# Patient Record
Sex: Female | Born: 1975
Health system: Southern US, Community
[De-identification: ages and names within clinical notes are randomized; demographics above are authoritative.]

## PROBLEM LIST (undated history)

## (undated) DIAGNOSIS — F3281 Premenstrual dysphoric disorder: Secondary | ICD-10-CM

## (undated) DIAGNOSIS — E669 Obesity, unspecified: Secondary | ICD-10-CM

## (undated) DIAGNOSIS — F39 Unspecified mood [affective] disorder: Secondary | ICD-10-CM

## (undated) DIAGNOSIS — E66811 Obesity, class 1: Secondary | ICD-10-CM

## (undated) DIAGNOSIS — L309 Dermatitis, unspecified: Secondary | ICD-10-CM

## (undated) HISTORY — DX: Unspecified mood (affective) disorder: F39

## (undated) HISTORY — PX: CHOLECYSTECTOMY: SHX55

## (undated) HISTORY — DX: Premenstrual dysphoric disorder: F32.81

## (undated) HISTORY — DX: Obesity, unspecified: E66.9

## (undated) HISTORY — DX: Obesity, class 1: E66.811

## (undated) HISTORY — PX: TONSILLECTOMY: SUR1361

## (undated) HISTORY — PX: WISDOM TOOTH EXTRACTION: SHX21

## (undated) HISTORY — DX: Dermatitis, unspecified: L30.9

---

## 2009-03-06 ENCOUNTER — Ambulatory Visit: Payer: Self-pay | Admitting: Family Medicine

## 2009-03-07 ENCOUNTER — Ambulatory Visit: Payer: Self-pay | Admitting: Surgery

## 2014-05-28 ENCOUNTER — Other Ambulatory Visit: Payer: Self-pay | Admitting: *Deleted

## 2017-01-07 ENCOUNTER — Telehealth: Payer: Self-pay

## 2017-01-07 NOTE — Telephone Encounter (Signed)
Pt has decided to wait and would like to try to see another provider but aware [facility]G would see her if needed.

## 2017-01-07 NOTE — Telephone Encounter (Signed)
Pt called triage line stating she has had hemorrhoids x2 weeks. She is having irritation in that area from the cream she has been using. She was wondering if [facility] could see her or does she need to go to her PCP? CB# [phone]

## 2017-01-08 ENCOUNTER — Ambulatory Visit (INDEPENDENT_AMBULATORY_CARE_PROVIDER_SITE_OTHER): Payer: 59 | Admitting: Certified Nurse Midwife

## 2017-01-08 ENCOUNTER — Encounter: Payer: Self-pay | Admitting: Certified Nurse Midwife

## 2017-01-08 VITALS — BP 110/70 | HR 71 | Ht 71.0 in | Wt 238.0 lb

## 2017-01-08 DIAGNOSIS — L249 Irritant contact dermatitis, unspecified cause: Secondary | ICD-10-CM

## 2017-01-08 DIAGNOSIS — F3281 Premenstrual dysphoric disorder: Secondary | ICD-10-CM | POA: Diagnosis not present

## 2017-01-08 DIAGNOSIS — L292 Pruritus vulvae: Secondary | ICD-10-CM | POA: Diagnosis not present

## 2017-01-08 DIAGNOSIS — L309 Dermatitis, unspecified: Secondary | ICD-10-CM | POA: Diagnosis not present

## 2017-01-08 DIAGNOSIS — F39 Unspecified mood [affective] disorder: Secondary | ICD-10-CM

## 2017-01-08 MED ORDER — TRIAMCINOLONE ACETONIDE 0.1 % EX OINT: 1.0000 "application " | TOPICAL_OINTMENT | Freq: Two times a day (BID) | CUTANEOUS | 0 refills | Status: DC

## 2017-01-11 ENCOUNTER — Encounter: Payer: Self-pay | Admitting: Certified Nurse Midwife

## 2017-01-11 DIAGNOSIS — F39 Unspecified mood [affective] disorder: Secondary | ICD-10-CM | POA: Insufficient documentation

## 2017-01-11 DIAGNOSIS — F3281 Premenstrual dysphoric disorder: Secondary | ICD-10-CM | POA: Insufficient documentation

## 2017-01-11 DIAGNOSIS — L309 Dermatitis, unspecified: Secondary | ICD-10-CM | POA: Insufficient documentation

## 2017-01-11 LAB — POCT WET PREP (WET MOUNT): TRICHOMONAS WET PREP HPF POC: ABSENT

## 2017-01-11 NOTE — Progress Notes (Signed)
Obstetrics & Gynecology Office Visit   Chief Complaint:  Chief Complaint  Patient presents with  . Pruritis    and irritation of vulva    History of Present Illness: 41 year old G3 P2013 who presents with complaints of vulvar itching and irritation after a recent lake trip. Prior to her lake trip she was using Preparation H for a hemorrhoid flare. While at the [place] she was swimming in the lake and last week had her menses. After the menses she began feeling irritated and itchy from the clitoral area to the perineal area. She used vagasil without relief. Has been taking antihistamines which has helped the itching. No increased vaginal discharge. Past medical history remarkable for eczema.  She also reports increased irritability premenstrually despite taking 100 mgm of Zoloft daily. She has been increasing her Zoloft to 150 mgm daily. Wants to know if she can stay on the dose. Has been on the Zoloft for many years for PMDD and a mood disorder   Review of Systems:  Review of Systems  Constitutional: Negative for chills and fever.  Genitourinary: Negative for dysuria.       See HPI  Skin: Positive for itching and rash.  Psychiatric/Behavioral:        Positive for irritability     Past Medical History:  Past Medical History:  Diagnosis Date  . Eczema   . Mood disorder ([place])   . PMDD (premenstrual dysphoric disorder)     Past Surgical History:  Past Surgical History:  Procedure Laterality Date  . CESAREAN SECTION  2006   Twins  . CHOLECYSTECTOMY    . TONSILLECTOMY    . WISDOM TOOTH EXTRACTION      Gynecologic History: Patient's last menstrual period was 01/01/2017 (exact date).  Obstetric History: [id number]  Family History:  Family History  Problem Relation Age of Onset  . Atrial fibrillation Mother   . Hypertension Mother   . Hyperlipidemia Mother   . Parkinson's disease Mother   . Prostate cancer Maternal Uncle 72  . Prostate cancer Father 53  . Prostate cancer  Paternal Grandfather     Social History:  Social History   Social History  . Marital status: Married    Spouse name: N/A  . Number of children: 3  . Years of education: N/A   Occupational History  . Not on file.   Social History Main Topics  . Smoking status: Never Smoker  . Smokeless tobacco: Never Used  . Alcohol use Yes  . Drug use: No  . Sexual activity: Yes    Birth control/ protection: None, Other-see comments     Comment: vasectomy   Other Topics Concern  . Not on file   Social History Narrative  . No narrative on file    Allergies:  Allergies  Allergen Reactions  . Hydrocodone Itching and Rash    Medications: Prior to Admission medications   Medication Sig Start Date End Date Taking? Authorizing Provider  sertraline (ZOLOFT) 100 MG tablet  01/07/17   [provider]  triamcinolone ointment (KENALOG) 0.1 % Apply 1 application topically 2 (two) times daily. 01/08/17   Dalia Heading, CNM    Physical Exam Vitals: BP 110/70   Pulse 71   Ht 5' 11"  (1.803 m)   Wt 238 lb (108 kg)   LMP 01/01/2017 (Exact Date)   BMI 33.19 kg/m   Physical Exam  Constitutional: She is oriented to person, place, and time. She appears well-developed and well-nourished. No  distress.  Genitourinary: Rectal exam shows external hemorrhoid (not thrombosed).  Genitourinary Comments: Vulva: no inflammation. Few red maculopapular lesions on labia Vagina: brown blood tinged discharge Cervix: no lesions   Neurological: She is alert and oriented to person, place, and time. She has normal reflexes.  Psychiatric: She has a normal mood and affect. Her behavior is normal.   Results for orders placed or performed in visit on 01/08/17 (from the past 24 hour(s))  POCT Wet Prep [facility])     Status: Normal   Collection Time: 01/11/17 10:07 PM  Result Value Ref Range   Source Wet Prep POC vagina    WBC, Wet Prep HPF POC     Bacteria Wet Prep HPF POC  Few   BACTERIA WET PREP  MORPHOLOGY POC     Clue Cells Wet Prep HPF POC None None   Clue Cells Wet Prep Whiff POC     Yeast Wet Prep HPF POC None    KOH Wet Prep POC     Trichomonas Wet Prep HPF POC Absent Absent    Assessment: 41 y.o. [id number] possible contact dermatitis No evidence of vaginitis Hemorrhoids Mood disorder/ PMDD-worsening symptoms  Plan: Kenalog 0.1% ointment-AAA 2-3 x/day prn itching Can increase Zoloft to 150 mgm daily. Recommend seeing a mental health provider if increasing Zoloft not effective-may need to be on a different medication Follow up for annual and prn persistent itching  Dalia Heading, CNM

## 2017-01-22 ENCOUNTER — Ambulatory Visit: Payer: Self-pay | Admitting: Certified Nurse Midwife

## 2017-07-30 DIAGNOSIS — M7541 Impingement syndrome of right shoulder: Secondary | ICD-10-CM | POA: Diagnosis not present

## 2017-11-04 ENCOUNTER — Ambulatory Visit: Payer: 59 | Admitting: Certified Nurse Midwife

## 2017-12-03 ENCOUNTER — Ambulatory Visit: Payer: Self-pay | Admitting: Certified Nurse Midwife

## 2017-12-22 ENCOUNTER — Ambulatory Visit (INDEPENDENT_AMBULATORY_CARE_PROVIDER_SITE_OTHER): Payer: BLUE CROSS/BLUE SHIELD | Admitting: Certified Nurse Midwife

## 2017-12-22 ENCOUNTER — Encounter: Payer: Self-pay | Admitting: Certified Nurse Midwife

## 2017-12-22 VITALS — BP 110/66 | HR 73 | Ht 71.0 in | Wt 240.0 lb

## 2017-12-22 DIAGNOSIS — Z1231 Encounter for screening mammogram for malignant neoplasm of breast: Secondary | ICD-10-CM | POA: Diagnosis not present

## 2017-12-22 DIAGNOSIS — Z124 Encounter for screening for malignant neoplasm of cervix: Secondary | ICD-10-CM | POA: Diagnosis not present

## 2017-12-22 DIAGNOSIS — Z01419 Encounter for gynecological examination (general) (routine) without abnormal findings: Secondary | ICD-10-CM

## 2017-12-22 DIAGNOSIS — Z0000 Encounter for general adult medical examination without abnormal findings: Secondary | ICD-10-CM | POA: Diagnosis not present

## 2017-12-22 DIAGNOSIS — Z1239 Encounter for other screening for malignant neoplasm of breast: Secondary | ICD-10-CM

## 2017-12-22 DIAGNOSIS — Z23 Encounter for immunization: Secondary | ICD-10-CM

## 2017-12-22 MED ORDER — SERTRALINE HCL 100 MG PO TABS
100.0000 mg | ORAL_TABLET | Freq: Every day | ORAL | 3 refills | Status: DC
Start: 2017-12-22 — End: 2019-03-03

## 2017-12-22 NOTE — Progress Notes (Signed)
Gynecology Annual Exam  PCP: Patient, No Pcp Per  Chief Complaint:  Chief Complaint  Patient presents with  . Gynecologic Exam    History of Present Illness:Kristine Ochoa is a 42 year old Caucasian/White female , G 3 P 2 1 1 3  , who presents for her annual exam . She is having problems with heavy menses.  Her menses are regular and her LMP was 11/27/2017 . They occur every 1 month , they last 5-7 days, with two heavier days requiring tampon change every 30-60 min and are accompanied by clots. Her bleeding does decrease with naprasyn, when she remembers to take the medication.  She has had no spotting.  She denies dysmenorrhea.  The patient's past medical history is remarkable for obesity (BMI=33.5 kg/m2) and a mood disorder for which she takes Zoloft. . She was having more problems with irritability premenstrually last July and she was given an RX for 150 mgm Zoloft daily, but she never had that filled. She remains on 100 mgm daily. Since her last annual GYN exam dated 07/10/2016, she reports no significant changes in her health She is sexually active. She is currently using a vasectomy for contraception.  Her most recent pap smear was obtained 07/10/2016 and was negative.  Her most recent mammogram obtained on 05/24/2015 was normal.  There is no family history of breast cancer.  There is no family history of ovarian cancer.  The patient does do occasional self breast exams.  The patient does not smoke.  The patient does drink socially. (0-6 glasses wine on the weekend)  The patient does not use illegal drugs.  The patient exercises regularly 4-5x/week.  The patient does get adequate calcium in her diet.  She had a recent cholesterol screen in 2016 that was normal.    .  Review of Systems: Review of Systems  Constitutional: Negative for chills, fever and weight loss.  HENT: Negative for congestion, sinus pain and sore throat.   Eyes: Negative for blurred vision and pain.    Respiratory: Negative for hemoptysis, shortness of breath and wheezing.   Cardiovascular: Negative for chest pain, palpitations and leg swelling.  Gastrointestinal: Negative for abdominal pain, blood in stool, diarrhea, heartburn, nausea and vomiting.  Genitourinary: Negative for dysuria, frequency, hematuria and urgency.  Musculoskeletal: Negative for back pain, joint pain and myalgias.  Skin: Negative for itching and rash.  Neurological: Negative for dizziness, tingling and headaches.  Endo/Heme/Allergies: Negative for environmental allergies and polydipsia. Does not bruise/bleed easily.       Negative for hirsutism   Psychiatric/Behavioral: Negative for depression. The patient is not nervous/anxious and does not have insomnia.     Past Medical History:  Past Medical History:  Diagnosis Date  . Eczema   . Mood disorder ([place])   . Obesity (BMI 30.0-34.9)   . PMDD (premenstrual dysphoric disorder)     Past Surgical History:  Past Surgical History:  Procedure Laterality Date  . CESAREAN SECTION  2006   Twins  . CHOLECYSTECTOMY    . TONSILLECTOMY    . WISDOM TOOTH EXTRACTION      Family History:  Family History  Problem Relation Age of Onset  . Atrial fibrillation Mother   . Hypertension Mother   . Hyperlipidemia Mother   . Parkinson's disease Mother   . Prostate cancer Father 65  . Atrial fibrillation Father   . Prostate cancer Paternal Grandfather   . Prostate cancer Paternal Uncle  Social History:  Social History   Socioeconomic History  . Marital status: Married    Spouse name: Not on file  . Number of children: 3  . Years of education: Not on file  . Highest education level: Not on file  Occupational History  . Not on file  Social Needs  . Financial resource strain: Not on file  . Food insecurity:    Worry: Not on file    Inability: Not on file  . Transportation needs:    Medical: Not on file    Non-medical: Not on file  Tobacco Use  . Smoking  status: Never Smoker  . Smokeless tobacco: Never Used  Substance and Sexual Activity  . Alcohol use: Yes  . Drug use: No  . Sexual activity: Yes    Birth control/protection: Other-see comments    Comment: vasectomy  Lifestyle  . Physical activity:    Days per week: 5 days    Minutes per session: 40 min  . Stress: Not at all  Relationships  . Social connections:    Talks on phone: Not on file    Gets together: Not on file    Attends religious service: Not on file    Active member of club or organization: Not on file    Attends meetings of clubs or organizations: Not on file    Relationship status: Not on file  . Intimate partner violence:    Fear of current or ex partner: Not on file    Emotionally abused: Not on file    Physically abused: Not on file    Forced sexual activity: Not on file  Other Topics Concern  . Not on file  Social History Narrative  . Not on file    Allergies:  Allergies  Allergen Reactions  . Hydrocodone Itching, Rash and Hives    Medications: Zoloft 100 mgm daily  Physical Exam Vitals: There were no vitals taken for this visit.  General: NAD HEENT: normocephalic, anicteric Neck: no thyroid enlargement, no palpable nodules, no cervical lymphadenopathy  Pulmonary: No increased work of breathing, CTAB Cardiovascular: RRR, with a Grade II/VI systolic murmur at the LSB  Breast: Breast symmetrical, no tenderness, no palpable nodules or masses, no skin or nipple retraction present, no nipple discharge.  No axillary, infraclavicular or supraclavicular lymphadenopathy. Abdomen: Soft, non-tender, non-distended.  Umbilicus without lesions.  No hepatomegaly or masses palpable. No evidence of hernia. Genitourinary:  External: Normal external female genitalia.  Normal urethral meatus, normal Bartholin's and Skene's glands.    Vagina: Normal vaginal mucosa, no evidence of prolapse.    Cervix: Grossly normal in appearance, no bleeding, non-tender,  posterior  Uterus: Anteverted, normal size, shape, and consistency, mobile, and non-tender  Adnexa: No adnexal masses, non-tender  Rectal: deferred  Lymphatic: no evidence of inguinal lymphadenopathy Extremities: no edema, erythema, or tenderness Neurologic: Grossly intact Psychiatric: mood appropriate, affect full     Assessment: 42 y.o. [id number] annual gyn exam Mood disorder controlled on Zoloft 100 mgm daily  Plan:   1) Breast cancer screening - recommend monthly self breast exam and annual mammograms. Mammogram was ordered today.  2) Zoloft 100 mgm refilled #90/ RF x 3.  3) Cervical cancer screening - Pap was done.  4) Routine healthcare maintenance including cholesterol and diabetes screening UTD  Immunizations: Last TD>10 years ago. TDAP today  ) RTO 1 year and prn  Dalia Heading, CNM

## 2017-12-27 LAB — IGP, APTIMA HPV
HPV APTIMA: NEGATIVE
PAP SMEAR COMMENT: 0

## 2018-06-13 ENCOUNTER — Encounter: Payer: Self-pay | Admitting: Obstetrics and Gynecology

## 2018-06-13 ENCOUNTER — Ambulatory Visit (INDEPENDENT_AMBULATORY_CARE_PROVIDER_SITE_OTHER): Payer: BLUE CROSS/BLUE SHIELD | Admitting: Obstetrics and Gynecology

## 2018-06-13 VITALS — BP 110/70 | HR 74 | Ht 71.0 in | Wt 243.0 lb

## 2018-06-13 DIAGNOSIS — K644 Residual hemorrhoidal skin tags: Secondary | ICD-10-CM | POA: Insufficient documentation

## 2018-06-13 MED ORDER — HYDROCORTISONE ACE-PRAMOXINE 2.5-1 % RE CREA: 1.0000 "application " | TOPICAL_CREAM | Freq: Two times a day (BID) | RECTAL | 0 refills | Status: AC

## 2018-06-13 NOTE — Progress Notes (Signed)
Patient, No Pcp Per   Chief Complaint  Patient presents with  . Hemorrhoids    kind of like a rash, x a week or so     HPI:      Ms. JON KASPAREK is a 42 y.o. [phone] who LMP was Patient's last menstrual period was 06/06/2018 (approximate)., presents today for hemorrhoid flare for the past wk. Sx occur due to constipation with menses. Has hx of them in past. Causes severe itching, burning, pain that radiates to vagina. No vaginal d/c, odor. No rectal bleeding. Has tried ice this time, and prep H wipes in past. Pt is very uncomfortable due to sx.    Past Medical History:  Diagnosis Date  . Eczema   . Mood disorder ([place])   . Obesity (BMI 30.0-34.9)   . PMDD (premenstrual dysphoric disorder)     Past Surgical History:  Procedure Laterality Date  . CESAREAN SECTION  2006   Twins  . CHOLECYSTECTOMY    . TONSILLECTOMY    . WISDOM TOOTH EXTRACTION      Family History  Problem Relation Age of Onset  . Atrial fibrillation Mother   . Hypertension Mother   . Hyperlipidemia Mother   . Parkinson's disease Mother 63  . Prostate cancer Father 39  . Atrial fibrillation Father   . Prostate cancer Paternal Grandfather   . Prostate cancer Paternal Uncle     Social History   Socioeconomic History  . Marital status: Married    Spouse name: Not on file  . Number of children: 3  . Years of education: Not on file  . Highest education level: Not on file  Occupational History  . Not on file  Social Needs  . Financial resource strain: Not on file  . Food insecurity:    Worry: Not on file    Inability: Not on file  . Transportation needs:    Medical: Not on file    Non-medical: Not on file  Tobacco Use  . Smoking status: Never Smoker  . Smokeless tobacco: Never Used  Substance and Sexual Activity  . Alcohol use: Yes  . Drug use: No  . Sexual activity: Yes    Birth control/protection: Other-see comments    Comment: vasectomy  Lifestyle  . Physical activity:   Days per week: 5 days    Minutes per session: 40 min  . Stress: Not at all  Relationships  . Social connections:    Talks on phone: Not on file    Gets together: Not on file    Attends religious service: Not on file    Active member of club or organization: Not on file    Attends meetings of clubs or organizations: Not on file    Relationship status: Not on file  . Intimate partner violence:    Fear of current or ex partner: Not on file    Emotionally abused: Not on file    Physically abused: Not on file    Forced sexual activity: Not on file  Other Topics Concern  . Not on file  Social History Narrative  . Not on file    Outpatient Medications Prior to Visit  Medication Sig Dispense Refill  . sertraline (ZOLOFT) 100 MG tablet Take 1 tablet (100 mg total) by mouth daily. 90 tablet 3   No facility-administered medications prior to visit.     ROS:  Review of Systems  Constitutional: Negative for fever.  Gastrointestinal: Negative for blood in stool,  constipation, diarrhea, nausea and vomiting.  Genitourinary: Positive for vaginal pain. Negative for dyspareunia, dysuria, flank pain, frequency, hematuria, urgency, vaginal bleeding and vaginal discharge.  Musculoskeletal: Negative for back pain.  Skin: Negative for rash.    OBJECTIVE:   Vitals:  BP 110/70   Pulse 74   Ht 5' 11"  (1.803 m)   Wt 243 lb (110.2 kg)   LMP 06/06/2018 (Approximate)   BMI 33.89 kg/m   Physical Exam  Constitutional: She is oriented to person, place, and time. She appears well-developed.  Neck: Normal range of motion.  Pulmonary/Chest: Effort normal.  Genitourinary:    There is no rash, tenderness or lesion on the right labia. There is no rash, tenderness or lesion on the left labia.  Genitourinary Comments: TOO TENDER FOR RECTAL EXAM  Musculoskeletal: Normal range of motion.  Neurological: She is alert and oriented to person, place, and time. No cranial nerve deficit.  Psychiatric: She  has a normal mood and affect. Her behavior is normal. Judgment and thought content normal.  Vitals reviewed.   Assessment/Plan: External hemorrhoids - Sitz baths/Rx anusol cream/cold compresses. Offered GI ref for future tx options. Pt to consider and f/u if desires.  - Plan: hydrocortisone-pramoxine (ANALPRAM-HC) 2.5-1 % rectal cream    Meds ordered this encounter  Medications  . hydrocortisone-pramoxine (ANALPRAM-HC) 2.5-1 % rectal cream    Sig: Place 1 application rectally 2 (two) times daily for 14 days.    Dispense:  30 g    Refill:  0    Order Specific Question:   Supervising Provider    Answer:   Gae Dry [[id number]]      Return if symptoms worsen or fail to improve.  Nissa Stannard B. Caylie Sandquist, PA-C 06/13/2018 1:58 PM

## 2018-06-13 NOTE — Patient Instructions (Signed)
I value your feedback and entrusting us with your care. If you get a Kristine Ochoa patient survey, I would appreciate you taking the time to let us know about your experience today. Thank you! 

## 2018-06-14 ENCOUNTER — Telehealth: Payer: Self-pay

## 2018-06-14 NOTE — Telephone Encounter (Signed)
Please advise 

## 2018-06-14 NOTE — Telephone Encounter (Addendum)
Pt is aware and doesn't think its a reaction to the hemorrhoid med.Marland Kitchen

## 2018-06-14 NOTE — Telephone Encounter (Signed)
Pt was seen yesterday and given medication for hemorrhoids.  States it has seemed to work.  Her hemorrhoids have decreased in size.  She does have a red rash from itching all around outside of vagina, around leg a back.  Is asking for a steroid cream to use on skin and outside of vagina.  [phone]

## 2018-06-14 NOTE — Telephone Encounter (Signed)
She didn't have this rash yesterday on exam. Is she having reaction to analpram hemorrhoid med? Given the location, best to use mild steroid, like over-the-counter 1% hydrocortisone crm QD to BID.

## 2018-06-16 DIAGNOSIS — N762 Acute vulvitis: Secondary | ICD-10-CM | POA: Diagnosis not present

## 2018-07-25 DIAGNOSIS — K648 Other hemorrhoids: Secondary | ICD-10-CM | POA: Diagnosis not present

## 2019-02-06 DIAGNOSIS — R300 Dysuria: Secondary | ICD-10-CM | POA: Diagnosis not present

## 2019-03-03 ENCOUNTER — Other Ambulatory Visit: Payer: Self-pay | Admitting: Certified Nurse Midwife

## 2019-05-29 ENCOUNTER — Ambulatory Visit: Payer: BLUE CROSS/BLUE SHIELD | Admitting: Certified Nurse Midwife

## 2019-06-12 ENCOUNTER — Other Ambulatory Visit: Payer: Self-pay

## 2019-06-12 DIAGNOSIS — Z20822 Contact with and (suspected) exposure to covid-19: Secondary | ICD-10-CM

## 2019-06-13 LAB — NOVEL CORONAVIRUS, NAA: SARS-CoV-2, NAA: NOT DETECTED

## 2019-06-23 ENCOUNTER — Other Ambulatory Visit: Payer: Self-pay | Admitting: Certified Nurse Midwife

## 2019-06-26 ENCOUNTER — Telehealth: Payer: Self-pay | Admitting: Certified Nurse Midwife

## 2019-06-26 NOTE — Telephone Encounter (Signed)
-----   Message from Dalia Heading, [place] sent at 06/23/2019  4:58 PM EST ----- Regarding: schedule annual appointment Please call patient to schedule her annual which is way overdue. Refilled Zoloft for 2 more months. No further refills until she has annual. Thank you, Jaclyn Shaggy

## 2019-06-26 NOTE — Telephone Encounter (Signed)
Patient is schedule for 07/28/19 with CLG

## 2019-07-28 ENCOUNTER — Ambulatory Visit: Payer: Self-pay | Admitting: Certified Nurse Midwife

## 2019-08-02 DIAGNOSIS — Z20828 Contact with and (suspected) exposure to other viral communicable diseases: Secondary | ICD-10-CM | POA: Diagnosis not present

## 2019-08-07 ENCOUNTER — Ambulatory Visit: Payer: Self-pay | Admitting: Certified Nurse Midwife

## 2019-08-08 DIAGNOSIS — Z20828 Contact with and (suspected) exposure to other viral communicable diseases: Secondary | ICD-10-CM | POA: Diagnosis not present

## 2019-08-25 ENCOUNTER — Other Ambulatory Visit: Payer: Self-pay | Admitting: Certified Nurse Midwife

## 2019-09-07 DIAGNOSIS — Z20828 Contact with and (suspected) exposure to other viral communicable diseases: Secondary | ICD-10-CM | POA: Diagnosis not present

## 2019-09-28 DIAGNOSIS — M7712 Lateral epicondylitis, left elbow: Secondary | ICD-10-CM | POA: Diagnosis not present

## 2019-10-03 NOTE — Progress Notes (Signed)
Gynecology Annual Exam  PCP: Patient, No Pcp Per  Chief Complaint:  Chief Complaint  Patient presents with  . Gynecologic Exam    whatever needs to be done    History of Present Illness:Kristine Ochoa is a 44 year old Caucasian/White female , G 3 P 2 1 1 3  , who presents for her annual exam . She is having no significant gyn problems.  Her menses are regular and her LMP was 09/12/2019 . They occur every 1 month , they last 4-6 days, usually with two heavier days requiring tampon change every 30-60 min and are accompanied by clots. She reports that over the last 6-7 months her flow has decreased over all. She has had no spotting.  She denies dysmenorrhea.  The patient's past medical history is remarkable for obesity (current BMI=32.52 kg/m2) and a mood disorder for which she takes Zoloft 100 mgm daily. Since her last annual GYN exam dated 12/22/2017 , she reports no significant changes in her health. She has lost 10# since her last annual with portion control, decreased junk food and walking.  She is sexually active. She is currently using a vasectomy for contraception.  Her most recent pap smear was obtained 12/22/2017 and was NIL/negative HRHPV.  Her most recent mammogram obtained on 05/24/2015 was normal.  There is no family history of breast cancer.  There is no family history of ovarian cancer.  The patient does do  self breast exams.  The patient does not smoke.  The patient does drink socially. (0-7 glasses wine/ week)  The patient does not use illegal drugs.  The patient exercises regularly 2x/week and walks most days of the week  The patient does get adequate calcium in her diet.  She had a  cholesterol screen in 2016 that was normal.    .  Review of Systems: Review of Systems  Constitutional: Negative for chills, fever and weight loss.  HENT: Negative for congestion, sinus pain and sore throat.   Eyes: Negative for blurred vision and pain.  Respiratory: Negative  for hemoptysis, shortness of breath and wheezing.   Cardiovascular: Negative for chest pain, palpitations and leg swelling.  Gastrointestinal: Negative for abdominal pain, blood in stool, diarrhea, heartburn, nausea and vomiting.  Genitourinary: Negative for dysuria, frequency, hematuria and urgency.  Musculoskeletal: Negative for back pain, joint pain and myalgias.  Skin: Negative for itching and rash.  Neurological: Negative for dizziness, tingling and headaches.  Endo/Heme/Allergies: Negative for environmental allergies and polydipsia. Does not bruise/bleed easily.       Negative for hirsutism   Psychiatric/Behavioral: Negative for depression. The patient is not nervous/anxious and does not have insomnia.     Past Medical History:  Past Medical History:  Diagnosis Date  . Eczema   . Mood disorder ([place])   . Obesity (BMI 30.0-34.9)   . PMDD (premenstrual dysphoric disorder)     Past Surgical History:  Past Surgical History:  Procedure Laterality Date  . CESAREAN SECTION  2006   Twins  . CHOLECYSTECTOMY    . TONSILLECTOMY    . WISDOM TOOTH EXTRACTION      Family History:  Family History  Problem Relation Age of Onset  . Atrial fibrillation Mother   . Hypertension Mother   . Hyperlipidemia Mother   . Parkinson's disease Mother 2  . Prostate cancer Father 13  . Atrial fibrillation Father   . Prostate cancer Paternal Grandfather   . Prostate cancer Paternal Uncle  Social History:  Social History   Socioeconomic History  . Marital status: Married    Spouse name: Not on file  . Number of children: 3  . Years of education: Not on file  . Highest education level: Not on file  Occupational History  . Not on file  Tobacco Use  . Smoking status: Never Smoker  . Smokeless tobacco: Never Used  Substance and Sexual Activity  . Alcohol use: Yes  . Drug use: No  . Sexual activity: Yes    Birth control/protection: Other-see comments    Comment: vasectomy  Other  Topics Concern  . Not on file  Social History Narrative  . Not on file   Social Determinants of Health   Financial Resource Strain:   . Difficulty of Paying Living Expenses:   Food Insecurity:   . Worried About Charity fundraiser in the Last Year:   . Arboriculturist in the Last Year:   Transportation Needs:   . Film/video editor (Medical):   Marland Kitchen Lack of Transportation (Non-Medical):   Physical Activity:   . Days of Exercise per Week:   . Minutes of Exercise per Session:   Stress:   . Feeling of Stress :   Social Connections:   . Frequency of Communication with Friends and Family:   . Frequency of Social Gatherings with Friends and Family:   . Attends Religious Services:   . Active Member of Clubs or Organizations:   . Attends Archivist Meetings:   Marland Kitchen Marital Status:   Intimate Partner Violence:   . Fear of Current or Ex-Partner:   . Emotionally Abused:   Marland Kitchen Physically Abused:   . Sexually Abused:     Allergies:  Allergies  Allergen Reactions  . Hydrocodone Itching, Rash and Hives    Medications:  Current Outpatient Medications:  .  sertraline (ZOLOFT) 100 MG tablet, Take 1 tablet (100 mg total) by mouth daily., Disp: 90 tablet, Rfl: 3 Vitamin D3 1000 IU and multivitamin daily x 1-2 months Physical Exam Vitals: BP 110/64   Pulse 66   Ht 5' 10.5" (1.791 m)   Wt 230 lb (104.3 kg)   LMP 09/12/2019 (Approximate)   BMI 32.54 kg/m   General: WF in NAD HEENT: normocephalic, anicteric Neck: no thyroid enlargement, no palpable nodules, no cervical lymphadenopathy  Pulmonary: No increased work of breathing, CTAB Cardiovascular: RRR, without murmur Breast: Breast symmetrical, no tenderness, no palpable nodules or masses, no skin or nipple retraction present, no nipple discharge.  No axillary, infraclavicular or supraclavicular lymphadenopathy. Abdomen: Soft, non-tender, non-distended.  Umbilicus without lesions.  No hepatomegaly or masses palpable. No  evidence of hernia. Genitourinary:  External: Normal external female genitalia.  Normal urethral meatus, normal Bartholin's and Skene's glands.    Vagina: Normal vaginal mucosa, no evidence of prolapse.    Cervix: Grossly normal in appearance, no bleeding, non-tender, posterior  Uterus: Anteverted, normal size, shape, and consistency, mobile, and non-tender  Adnexa: No adnexal masses, non-tender  Rectal: deferred  Lymphatic: no evidence of inguinal lymphadenopathy Extremities: no edema, erythema, or tenderness Neurologic: Grossly intact Psychiatric: mood appropriate, affect full     Assessment: 44 y.o. [id number] annual gyn exam Mood disorder controlled on Zoloft 100 mgm daily  Plan:   1) Breast cancer screening - recommend monthly self breast exam and annual mammograms. Mammogram was ordered today.  2) Zoloft 100 mgm refilled #90/ RF x 3.  3) Cervical cancer screening - Pap was not done.  Desires Pap smears every 3 years. Next due in 2022  4) Routine healthcare maintenance including cholesterol and diabetes screening ordered today. Discussed options for colon cancer screening: FIT testing, Cologuard, colonoscopy. To consider options and let me know if she desires to start this year.  5)  RTO 1 year and prn  Dalia Heading, CNM

## 2019-10-04 ENCOUNTER — Other Ambulatory Visit: Payer: Self-pay

## 2019-10-04 ENCOUNTER — Encounter: Payer: Self-pay | Admitting: Certified Nurse Midwife

## 2019-10-04 ENCOUNTER — Ambulatory Visit (INDEPENDENT_AMBULATORY_CARE_PROVIDER_SITE_OTHER): Payer: BLUE CROSS/BLUE SHIELD | Admitting: Certified Nurse Midwife

## 2019-10-04 VITALS — BP 110/64 | HR 66 | Ht 70.5 in | Wt 230.0 lb

## 2019-10-04 DIAGNOSIS — Z131 Encounter for screening for diabetes mellitus: Secondary | ICD-10-CM | POA: Diagnosis not present

## 2019-10-04 DIAGNOSIS — Z01419 Encounter for gynecological examination (general) (routine) without abnormal findings: Secondary | ICD-10-CM

## 2019-10-04 DIAGNOSIS — Z13220 Encounter for screening for lipoid disorders: Secondary | ICD-10-CM | POA: Diagnosis not present

## 2019-10-04 DIAGNOSIS — Z1239 Encounter for other screening for malignant neoplasm of breast: Secondary | ICD-10-CM

## 2019-10-04 DIAGNOSIS — F39 Unspecified mood [affective] disorder: Secondary | ICD-10-CM

## 2019-10-04 MED ORDER — SERTRALINE HCL 100 MG PO TABS: 100.0000 mg | ORAL_TABLET | Freq: Every day | ORAL | 3 refills | Status: DC

## 2019-10-05 LAB — LIPID PANEL WITH LDL/HDL RATIO
Cholesterol, Total: 200 mg/dL — ABNORMAL HIGH (ref 100–199)
HDL: 59 mg/dL (ref >39)
LDL Chol Calc (NIH): 114 mg/dL — ABNORMAL HIGH (ref 0–99)
LDL/HDL Ratio: 1.9 ratio (ref 0.0–3.2)
Triglycerides: 154 mg/dL — ABNORMAL HIGH (ref 0–149)
VLDL Cholesterol Cal: 27 mg/dL (ref 5–40)

## 2019-10-05 LAB — HEMOGLOBIN A1C
Est. average glucose Bld gHb Est-mCnc: 108 mg/dL
Hgb A1c MFr Bld: 5.4 % (ref 4.8–5.6)

## 2019-10-08 ENCOUNTER — Encounter: Payer: Self-pay | Admitting: Certified Nurse Midwife

## 2019-11-08 DIAGNOSIS — Z0000 Encounter for general adult medical examination without abnormal findings: Secondary | ICD-10-CM | POA: Diagnosis not present

## 2019-11-08 DIAGNOSIS — E6609 Other obesity due to excess calories: Secondary | ICD-10-CM | POA: Diagnosis not present

## 2019-11-08 DIAGNOSIS — Z6832 Body mass index (BMI) 32.0-32.9, adult: Secondary | ICD-10-CM | POA: Diagnosis not present

## 2019-11-08 DIAGNOSIS — F39 Unspecified mood [affective] disorder: Secondary | ICD-10-CM | POA: Diagnosis not present

## 2020-01-04 DIAGNOSIS — Z20828 Contact with and (suspected) exposure to other viral communicable diseases: Secondary | ICD-10-CM | POA: Diagnosis not present

## 2020-01-04 DIAGNOSIS — U071 COVID-19: Secondary | ICD-10-CM | POA: Diagnosis not present

## 2020-01-04 DIAGNOSIS — Z03818 Encounter for observation for suspected exposure to other biological agents ruled out: Secondary | ICD-10-CM | POA: Diagnosis not present

## 2020-01-29 DIAGNOSIS — J0100 Acute maxillary sinusitis, unspecified: Secondary | ICD-10-CM | POA: Diagnosis not present

## 2020-01-29 DIAGNOSIS — R062 Wheezing: Secondary | ICD-10-CM | POA: Diagnosis not present

## 2020-01-29 DIAGNOSIS — Z8616 Personal history of COVID-19: Secondary | ICD-10-CM | POA: Diagnosis not present

## 2020-01-29 DIAGNOSIS — R05 Cough: Secondary | ICD-10-CM | POA: Diagnosis not present

## 2020-12-10 ENCOUNTER — Other Ambulatory Visit: Payer: Self-pay

## 2020-12-10 ENCOUNTER — Encounter: Payer: Self-pay | Admitting: Family Medicine

## 2020-12-10 ENCOUNTER — Ambulatory Visit: Payer: BLUE CROSS/BLUE SHIELD | Admitting: Family Medicine

## 2020-12-10 ENCOUNTER — Ambulatory Visit: Payer: Self-pay

## 2020-12-10 ENCOUNTER — Ambulatory Visit (INDEPENDENT_AMBULATORY_CARE_PROVIDER_SITE_OTHER): Payer: BLUE CROSS/BLUE SHIELD

## 2020-12-10 VITALS — BP 110/64 | HR 80 | Ht 70.5 in | Wt 243.2 lb

## 2020-12-10 DIAGNOSIS — M1812 Unilateral primary osteoarthritis of first carpometacarpal joint, left hand: Secondary | ICD-10-CM | POA: Diagnosis not present

## 2020-12-10 DIAGNOSIS — M25532 Pain in left wrist: Secondary | ICD-10-CM

## 2020-12-10 NOTE — Progress Notes (Signed)
   I, Kristine Ochoa, LAT, ATC, am serving as scribe for Dr. Lynne Ochoa.  Subjective:    CC: Left hand pain  HPI: Pt is a 45 y/o RHD female c/o L radial-sided hand and wrist pain x 6 months .  Pt was previously seen by [facility] on 09/28/19 for L lateral epicondylitis and was treated w/ an injection and a counterforce strap. Pt locates pain to her L radial wrist and thumb.  Neck pain: No UE numbness/tingling: yes at her L radial wrist and thumb UE weakness: No Aggravates: gripping; opening things Treatments tried: IBU; brace  Pertinent review of Systems: No fevers or chills  Relevant historical information: Hemorrhoids.  PMDD   Objective:    Vitals:   12/10/20 1534  BP: 110/64  Pulse: 80  SpO2: 95%   General: Well Developed, well nourished, and in no acute distress.   MSK: Left wrist normal. Left thumb slight bossing first CMC. Normal wrist and thumb motion. Normal strength. Nontender radial styloid.  Nontender snuffbox.  Nontender into the thumb or wrist. Negative Finkelstein's test. Pulses cap refill and sensation are intact.    Lab and Radiology Results  Diagnostic Limited MSK Ultrasound of: Left wrist and thumb Normal-appearing first dorsal wrist compartment.  No tenosynovitis apparent. Normal bony structures. First CMC degenerative with effusion Impression: First CMC DJD with effusion  X-ray images left hand obtained today personally and independently interpreted [facility] DJD mild to moderate.  No acute fractures. Await formal radiology review    Impression and Recommendations:    Assessment and Plan: 45 y.o. female with left wrist/base of thumb pain.  After physical exam and ultrasound dominant finding is DJD at the base of the thumb and first [place].  Discussed options.  Patient would like to proceed with conservative management trial.  Recommend short thumb spica splint and Voltaren gel heat and home exercise program.  If not improving would consider  referral to hand PT and injection.  Patient will keep me updated and return to clinic as needed.  Happy to place PT order before she returns as needed.Marland Kitchen  PDMP not reviewed this encounter. Orders Placed This Encounter  Procedures  . [place] LIMITED JOINT SPACE STRUCTURES UP LEFT(NO LINKED CHARGES)    Order Specific Question:   Reason for Exam (SYMPTOM  OR DIAGNOSIS REQUIRED)    Answer:   L wrist pain    Order Specific Question:   Preferred imaging location?    Answer:   [place]  . DG Hand Complete Left    Standing Status:   Future    Number of Occurrences:   1    Standing Expiration Date:   12/10/2021    Order Specific Question:   Reason for Exam (SYMPTOM  OR DIAGNOSIS REQUIRED)    Answer:   eval pain base thumb    Order Specific Question:   Is patient pregnant?    Answer:   No    Order Specific Question:   Preferred imaging location?    Answer:   [facility]   No orders of the defined types were placed in this encounter.   Discussed warning signs or symptoms. Please see discharge instructions. Patient expresses understanding.   The above documentation has been reviewed and is accurate and complete Kristine Ochoa, M.D.

## 2020-12-10 NOTE — Patient Instructions (Addendum)
Thank you for coming in today.  Please use Voltaren gel (Generic Diclofenac Gel) up to 4x daily for pain as needed.  This is available over-the-counter as both the name brand Voltaren gel and the generic diclofenac gel.  Try a thumb spica brace or splint. (The short one).   A medical supply company should have a variety of these.   [employer] will too.  I think the main issue is arthritis in the base of the thumb.   Return as needed.   Next step is hand Pt or injection. Let me know.   Please get an Xray today before you leave

## 2020-12-11 DIAGNOSIS — M1812 Unilateral primary osteoarthritis of first carpometacarpal joint, left hand: Secondary | ICD-10-CM | POA: Insufficient documentation

## 2020-12-13 NOTE — Progress Notes (Signed)
X-ray left hand shows arthritis at the base of the thumb

## 2020-12-16 NOTE — Progress Notes (Signed)
Spoke w/ pt and conveyed Dr. Clovis Riley result note. Pt verbalized understanding.

## 2020-12-31 ENCOUNTER — Ambulatory Visit: Payer: Self-pay | Admitting: Family Medicine

## 2021-01-07 ENCOUNTER — Other Ambulatory Visit: Payer: Self-pay

## 2021-01-07 ENCOUNTER — Ambulatory Visit (INDEPENDENT_AMBULATORY_CARE_PROVIDER_SITE_OTHER): Payer: BLUE CROSS/BLUE SHIELD | Admitting: Obstetrics and Gynecology

## 2021-01-07 ENCOUNTER — Encounter: Payer: Self-pay | Admitting: Obstetrics and Gynecology

## 2021-01-07 VITALS — BP 100/70 | Ht 71.0 in | Wt 245.0 lb

## 2021-01-07 DIAGNOSIS — Z1211 Encounter for screening for malignant neoplasm of colon: Secondary | ICD-10-CM

## 2021-01-07 DIAGNOSIS — F39 Unspecified mood [affective] disorder: Secondary | ICD-10-CM | POA: Diagnosis not present

## 2021-01-07 DIAGNOSIS — Z1231 Encounter for screening mammogram for malignant neoplasm of breast: Secondary | ICD-10-CM

## 2021-01-07 DIAGNOSIS — Z13220 Encounter for screening for lipoid disorders: Secondary | ICD-10-CM

## 2021-01-07 DIAGNOSIS — E785 Hyperlipidemia, unspecified: Secondary | ICD-10-CM

## 2021-01-07 DIAGNOSIS — Z01419 Encounter for gynecological examination (general) (routine) without abnormal findings: Secondary | ICD-10-CM | POA: Diagnosis not present

## 2021-01-07 MED ORDER — SERTRALINE HCL 100 MG PO TABS: 100.0000 mg | ORAL_TABLET | Freq: Every day | ORAL | 3 refills | Status: DC

## 2021-01-07 NOTE — Progress Notes (Signed)
PCP: Patient, No Pcp Per (Inactive)   Chief Complaint  Patient presents with   Gynecologic Exam    Has menopause qs     HPI:      Kristine Ochoa is a 45 y.o. L2G4010 whose LMP was Patient's last menstrual period was 12/17/2020 (approximate)., presents today for her annual examination.  Her menses are regular every 28-30 days, lasting 4-7 days, 1 heavy day with clots, but this is improved over the years. Used to be even heavier.  Dysmenorrhea none. She does have intermenstrual bleeding. She does have regular night sweats.   Sex activity: single partner, contraception - vasectomy. She does not have vaginal dryness.  Last Pap:  12/22/17 Results were: no abnormalities /neg HPV DNA. No hx of abn paps.  Last mammogram: 05/24/15 at [facility].  Results were: normal--routine follow-up in 12 months There is no FH of breast cancer. There is no FH of ovarian cancer. The patient does do self-breast exams. Strong FH prostate cancer on her pat side, including her father, but no chemo tx done for any of them or mets that she knows of. Will cont to follow FH.   Colonoscopy: never  Tobacco use: The patient denies current or previous tobacco use. Alcohol use: social drinker Exercise: moderately active  She does get adequate calcium and Vitamin D in her diet.  Borderline lipids, normal DM screen 3/21. Due for lipid recheck Takes zoloft for mood disorder/PMDD with sx relief. Would like RF  Past Medical History:  Diagnosis Date   Eczema    Mood disorder ([place])    Obesity (BMI 30.0-34.9)    PMDD (premenstrual dysphoric disorder)     Past Surgical History:  Procedure Laterality Date   CESAREAN SECTION  2006   Twins   CHOLECYSTECTOMY     TONSILLECTOMY     WISDOM TOOTH EXTRACTION      Family History  Problem Relation Age of Onset   Atrial fibrillation Mother    Hypertension Mother    Hyperlipidemia Mother    Parkinson's disease Mother 72   Prostate cancer Father 69   Atrial  fibrillation Father    Prostate cancer Paternal Grandfather    Prostate cancer Paternal Uncle     Social History   Socioeconomic History   Marital status: Married    Spouse name: Not on file   Number of children: 3   Years of education: Not on file   Highest education level: Not on file  Occupational History   Not on file  Tobacco Use   Smoking status: Former    Pack years: 0.00   Smokeless tobacco: Former    Quit date: 2010  Vaping Use   Vaping Use: Never used  Substance and Sexual Activity   Alcohol use: Yes   Drug use: No   Sexual activity: Yes    Birth control/protection: Other-see comments    Comment: vasectomy  Other Topics Concern   Not on file  Social History Narrative   Not on file   Social Determinants of Health   Financial Resource Strain: Not on file  Food Insecurity: Not on file  Transportation Needs: Not on file  Physical Activity: Not on file  Stress: Not on file  Social Connections: Not on file  Intimate Partner Violence: Not on file     Current Outpatient Medications:    cephALEXin (KEFLEX) 500 MG capsule, Take 500 mg by mouth 2 (two) times daily., Disp: , Rfl:    neomycin-polymyxin b-dexamethasone (MAXITROL)  3.5-10000-0.1 SUSP, Place 1 drop into the right eye 4 (four) times daily., Disp: , Rfl:    sertraline (ZOLOFT) 100 MG tablet, Take 1 tablet (100 mg total) by mouth daily., Disp: 90 tablet, Rfl: 3     ROS:  Review of Systems  Constitutional:  Negative for fatigue, fever and unexpected weight change.  Respiratory:  Negative for cough, shortness of breath and wheezing.   Cardiovascular:  Negative for chest pain, palpitations and leg swelling.  Gastrointestinal:  Negative for blood in stool, constipation, diarrhea, nausea and vomiting.  Endocrine: Negative for cold intolerance, heat intolerance and polyuria.  Genitourinary:  Negative for dyspareunia, dysuria, flank pain, frequency, genital sores, hematuria, menstrual problem, pelvic pain,  urgency, vaginal bleeding, vaginal discharge and vaginal pain.  Musculoskeletal:  Negative for back pain, joint swelling and myalgias.  Skin:  Negative for rash.  Neurological:  Negative for dizziness, syncope, light-headedness, numbness and headaches.  Hematological:  Negative for adenopathy.  Psychiatric/Behavioral:  Negative for agitation, confusion, sleep disturbance and suicidal ideas. The patient is not nervous/anxious.   BREAST: No symptoms    Objective: BP 100/70   Ht 5' 11"  (1.803 m)   Wt 245 lb (111.1 kg)   LMP 12/17/2020 (Approximate)   BMI 34.17 kg/m    Physical Exam Constitutional:      Appearance: She is well-developed.  Genitourinary:     Vulva normal.     Right Labia: No rash, tenderness or lesions.    Left Labia: No tenderness, lesions or rash.    No vaginal discharge, erythema or tenderness.      Right Adnexa: not tender and no mass present.    Left Adnexa: not tender and no mass present.    No cervical friability or polyp.     Uterus is not enlarged or tender.  Breasts:    Right: No mass, nipple discharge, skin change or tenderness.     Left: No mass, nipple discharge, skin change or tenderness.  Neck:     Thyroid: No thyromegaly.  Cardiovascular:     Rate and Rhythm: Normal rate and regular rhythm.     Heart sounds: Normal heart sounds. No murmur heard. Pulmonary:     Effort: Pulmonary effort is normal.     Breath sounds: Normal breath sounds.  Abdominal:     Palpations: Abdomen is soft.     Tenderness: There is no abdominal tenderness. There is no guarding or rebound.  Musculoskeletal:        General: Normal range of motion.     Cervical back: Normal range of motion.  Lymphadenopathy:     Cervical: No cervical adenopathy.  Neurological:     General: No focal deficit present.     Mental Status: She is alert and oriented to person, place, and time.     Cranial Nerves: No cranial nerve deficit.  Skin:    General: Skin is warm and dry.   Psychiatric:        Mood and Affect: Mood normal.        Behavior: Behavior normal.        Thought Content: Thought content normal.        Judgment: Judgment normal.  Vitals reviewed.    Assessment/Plan:  Encounter for annual routine gynecological examination  Encounter for screening mammogram for malignant neoplasm of breast - Plan: MM 3D SCREEN BREAST BILATERAL; pt to sched mammo  Screening for colon cancer - Plan: Cologuard; colonoscopy/cologuard discussed. Pt elects cologuard. Ref sent. Will f/u with  results.  Mood disorder ([place]) - Plan: sertraline (ZOLOFT) 100 MG tablet; Rx RF. Doing well.   Elevated lipids - Plan: Lipid panel  Screening cholesterol level - Plan: Lipid panel   Meds ordered this encounter  Medications   sertraline (ZOLOFT) 100 MG tablet    Sig: Take 1 tablet (100 mg total) by mouth daily.    Dispense:  90 tablet    Refill:  3    Order Specific Question:   Supervising Provider    Answer:   Gae Dry [[id number]]            GYN counsel breast self exam, mammography screening, menopause, adequate intake of calcium and vitamin D, diet and exercise    F/U  Return in about 1 year (around 01/07/2022).  Kristine Stream B. Jaelen Gellerman, PA-C 01/07/2021 4:43 PM

## 2021-01-07 NOTE — Patient Instructions (Signed)
I value your feedback and you entrusting [place] with your care. If you get a  patient survey, I would appreciate you taking the time to let us know about your experience today. Thank you!  [place] at [facility]: [place]: [phone]

## 2021-01-21 ENCOUNTER — Other Ambulatory Visit: Payer: BLUE CROSS/BLUE SHIELD

## 2021-01-21 ENCOUNTER — Other Ambulatory Visit: Payer: Self-pay

## 2021-01-21 DIAGNOSIS — Z13220 Encounter for screening for lipoid disorders: Secondary | ICD-10-CM

## 2021-01-21 DIAGNOSIS — E785 Hyperlipidemia, unspecified: Secondary | ICD-10-CM

## 2021-01-22 LAB — LIPID PANEL
Chol/HDL Ratio: 3.5 ratio (ref 0.0–4.4)
Cholesterol, Total: 202 mg/dL — ABNORMAL HIGH (ref 100–199)
HDL: 57 mg/dL (ref >39)
LDL Chol Calc (NIH): 127 mg/dL — ABNORMAL HIGH (ref 0–99)
Triglycerides: 103 mg/dL (ref 0–149)
VLDL Cholesterol Cal: 18 mg/dL (ref 5–40)

## 2021-01-24 NOTE — Addendum Note (Signed)
Addended by: Ardeth Perfect B on: 0/01/1218 10:49 AM   Modules accepted: Orders

## 2021-02-03 DIAGNOSIS — Z1211 Encounter for screening for malignant neoplasm of colon: Secondary | ICD-10-CM

## 2021-02-03 HISTORY — DX: Encounter for screening for malignant neoplasm of colon: Z12.11

## 2021-02-07 LAB — COLOGUARD
COLOGUARD: NEGATIVE
Cologuard: NEGATIVE

## 2021-02-07 LAB — EXTERNAL GENERIC LAB PROCEDURE: COLOGUARD: NEGATIVE

## 2021-02-17 ENCOUNTER — Telehealth: Payer: Self-pay

## 2021-02-17 ENCOUNTER — Encounter: Payer: Self-pay | Admitting: Obstetrics and Gynecology

## 2021-02-17 NOTE — Telephone Encounter (Signed)
Called pt to let her know NEG Cologuard Test result. No answer, LVMTRC.

## 2021-02-24 ENCOUNTER — Ambulatory Visit
Admission: RE | Admit: 2021-02-24 | Discharge: 2021-02-24 | Disposition: A | Discharge status: Home / Self Care | Payer: BLUE CROSS/BLUE SHIELD | Source: Ambulatory Visit | Attending: Obstetrics and Gynecology | Admitting: Obstetrics and Gynecology

## 2021-02-24 ENCOUNTER — Other Ambulatory Visit: Payer: Self-pay

## 2021-02-24 DIAGNOSIS — Z1231 Encounter for screening mammogram for malignant neoplasm of breast: Secondary | ICD-10-CM

## 2021-03-17 NOTE — Progress Notes (Signed)
I, Kristine Ochoa, LAT, ATC, am serving as scribe for Dr. Lynne Leader.  Kristine Ochoa is a 45 y.o. female who presents to [place] at [facility] today for R wrist pain.  She was last seen by Dr. Georgina Snell on 12/10/20 for L radial-sided wrist and thumb pain and was advised to use a short thumb spica splint and Voltaren gel.  Today, pt reports she hit the R wrist on a doorknob over [employer]. Pt locates pain to the dorsum of her wrist and distal forearm w/ radiating pain proximally to elbow. Pt notes that sometimes wrist/forearm will swell. Pt has been wearing a brace at night and the pain is waking her up at night.  Diagnostic imaging: L hand XR- 12/10/20  Pertinent review of systems: No fevers or chills  Relevant historical information: History of PMDD.  Left thumb DJD.   Exam:  BP 130/88   Pulse 63   Ht 5' 11"  (1.803 m)   Wt 246 lb 9.6 oz (111.9 kg)   LMP 02/16/2021   SpO2 97%   BMI 34.39 kg/m  General: Well Developed, well nourished, and in no acute distress.   MSK: Right wrist normal-appearing Nontender dorsal wrist. Intact strength. Pain with full dorsiflexion of wrist with some lack of full range of motion. Pulses cap refill and sensation are intact distally.    Lab and Radiology Results  Diagnostic Limited MSK Ultrasound of: Right wrist The wrist with no significant joint effusion. Tiny hyperechoic fleck at distal radius and ulna. Slight hyperechoic change surrounds fourth and second dorsal wrist compartments tendon sheath. No other significant bony abnormalities or tendon abnormalities visible. Impression: Largely normal MSK ultrasound wrist.  X-ray images right wrist obtained today personally and independently interpreted Normal-appearing x-ray no acute fractures or significant degenerative changes. Await formal radiology review    Assessment and Plan: 45 y.o. female with right dorsal wrist pain.  Pain occurred little over 3 months  ago after she had a contusion to the dorsal hand and wrist.  Pain has been ongoing despite simple conservative management and activities including home exercise program, Voltaren gel, and wrist braces at night.  Plan for ultrasound and x-ray today and continued pragmatic conservative measures.  Also refer to hand PT/OT in [place].  If not improved in 6 weeks proceed to MRI arthrogram.   PDMP not reviewed this encounter. Orders Placed This Encounter  Procedures   DG Wrist Complete Right    Standing Status:   Future    Number of Occurrences:   1    Standing Expiration Date:   03/18/2022    Order Specific Question:   Reason for Exam (SYMPTOM  OR DIAGNOSIS REQUIRED)    Answer:   right wrist pain    Order Specific Question:   Preferred imaging location?    Answer:   [facility]    Order Specific Question:   Is patient pregnant?    Answer:   No   [place] LIMITED JOINT SPACE STRUCTURES UP RIGHT(NO LINKED CHARGES)    Standing Status:   Future    Number of Occurrences:   1    Standing Expiration Date:   09/15/2021    Order Specific Question:   Reason for Exam (SYMPTOM  OR DIAGNOSIS REQUIRED)    Answer:   right wrist pain    Order Specific Question:   Preferred imaging location?    Answer:   [facility]   Ambulatory referral to Physical  Therapy    Referral Priority:   Routine    Referral Type:   Physical Medicine    Referral Reason:   Specialty Services Required    Requested Specialty:   Physical Therapy    Number of Visits Requested:   1   No orders of the defined types were placed in this encounter.    Discussed warning signs or symptoms. Please see discharge instructions. Patient expresses understanding.   The above documentation has been reviewed and is accurate and complete Lynne Leader, M.D.

## 2021-03-18 ENCOUNTER — Ambulatory Visit (INDEPENDENT_AMBULATORY_CARE_PROVIDER_SITE_OTHER): Payer: BLUE CROSS/BLUE SHIELD

## 2021-03-18 ENCOUNTER — Ambulatory Visit: Payer: BLUE CROSS/BLUE SHIELD | Admitting: Family Medicine

## 2021-03-18 ENCOUNTER — Other Ambulatory Visit: Payer: Self-pay

## 2021-03-18 ENCOUNTER — Ambulatory Visit: Payer: Self-pay

## 2021-03-18 VITALS — BP 130/88 | HR 63 | Ht 71.0 in | Wt 246.6 lb

## 2021-03-18 DIAGNOSIS — M25539 Pain in unspecified wrist: Secondary | ICD-10-CM

## 2021-03-18 DIAGNOSIS — M25531 Pain in right wrist: Secondary | ICD-10-CM

## 2021-03-18 NOTE — Patient Instructions (Signed)
Thank you for coming in today.   Please get an Xray today before you leave   I've referred you to Physical Therapy.  Let us know if you don't hear from them in one week.   If not improving let me know. Typically in 6 weeks if not better next step would be MRI arthrogram.   Ok to use the brace.   If you want a cortisone shot before then I could do one.

## 2021-03-19 NOTE — Progress Notes (Signed)
Right wrist x-ray looks normal to radiology

## 2021-10-26 IMAGING — DX DG WRIST COMPLETE 3+V*R*
4 series · 4 of 4 positions shown · non-contrast
Comparison: None.

CLINICAL DATA: Right wrist pain.

EXAM:
RIGHT WRIST - COMPLETE 3+ VIEW

[wrist ap]
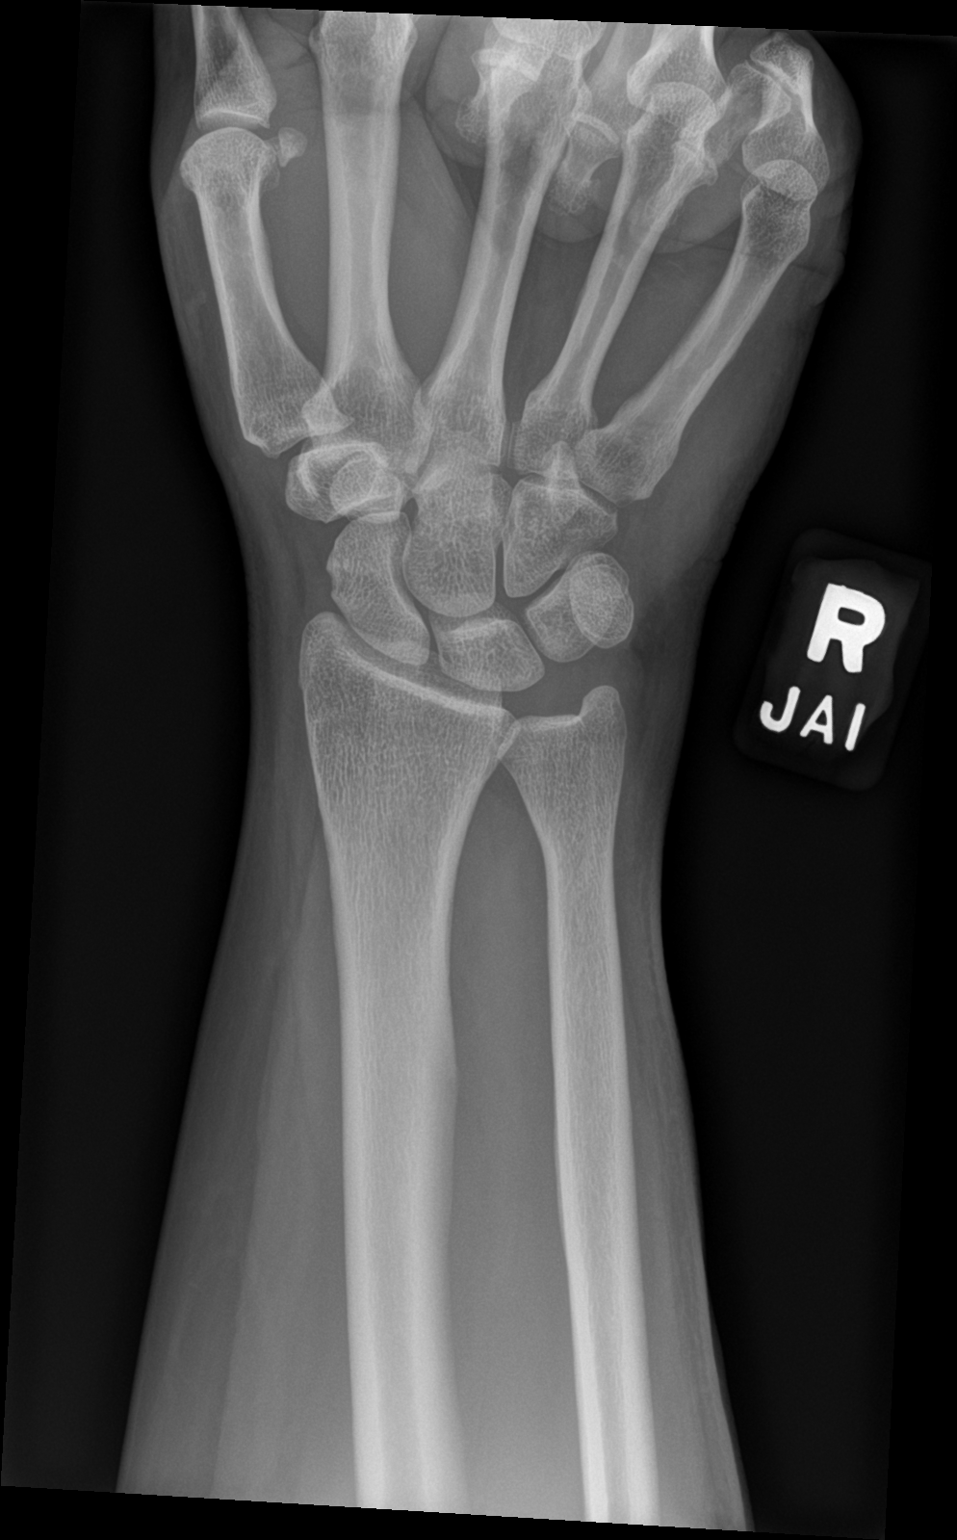

[wrist obl]
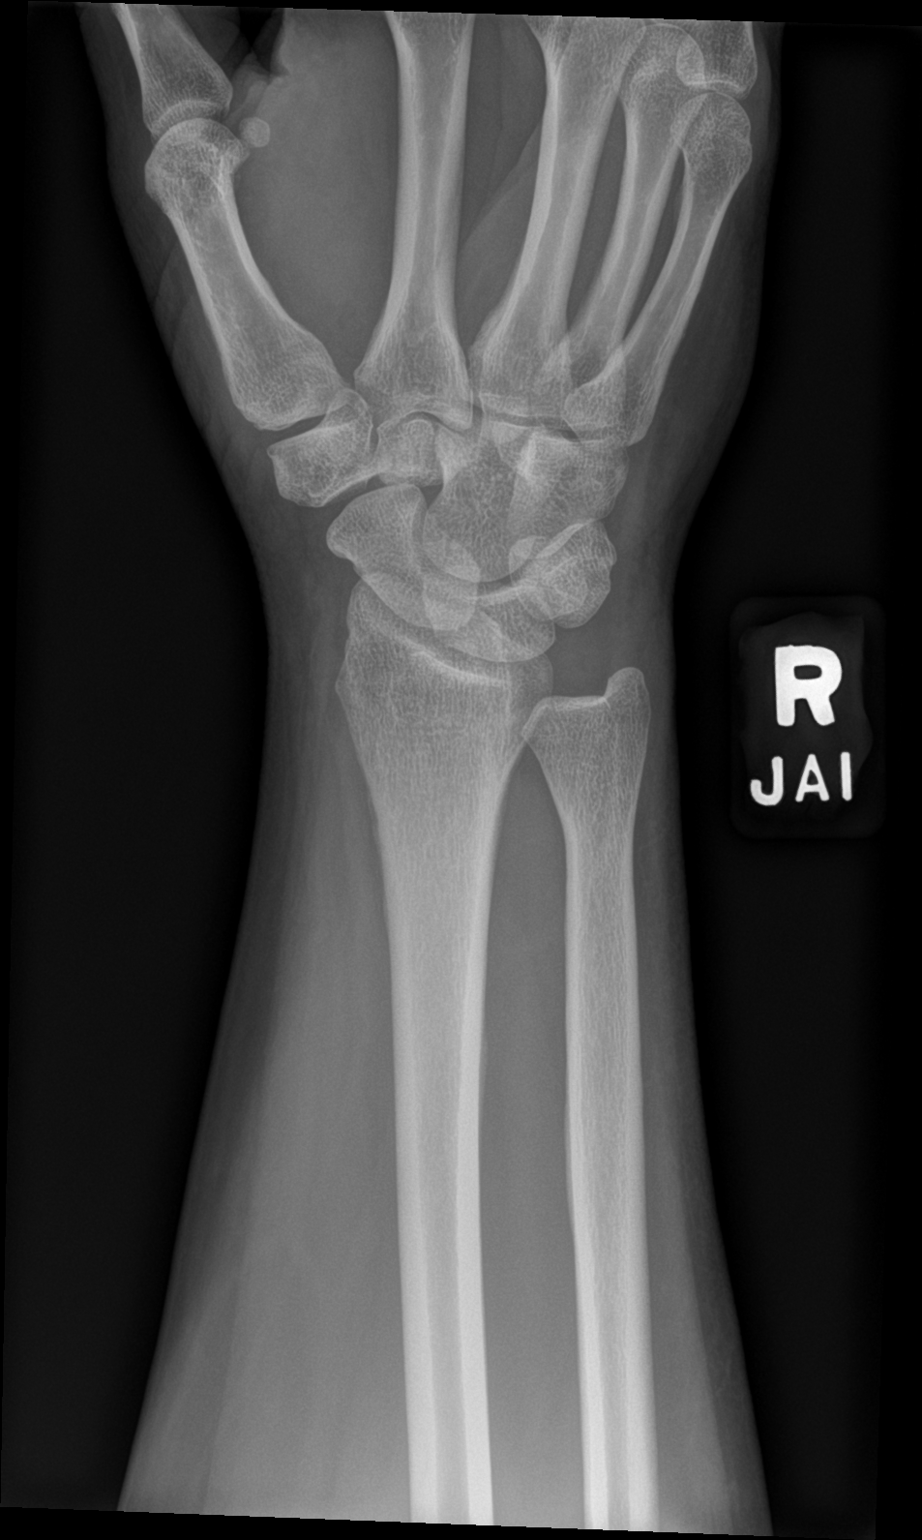

[wrist lat]
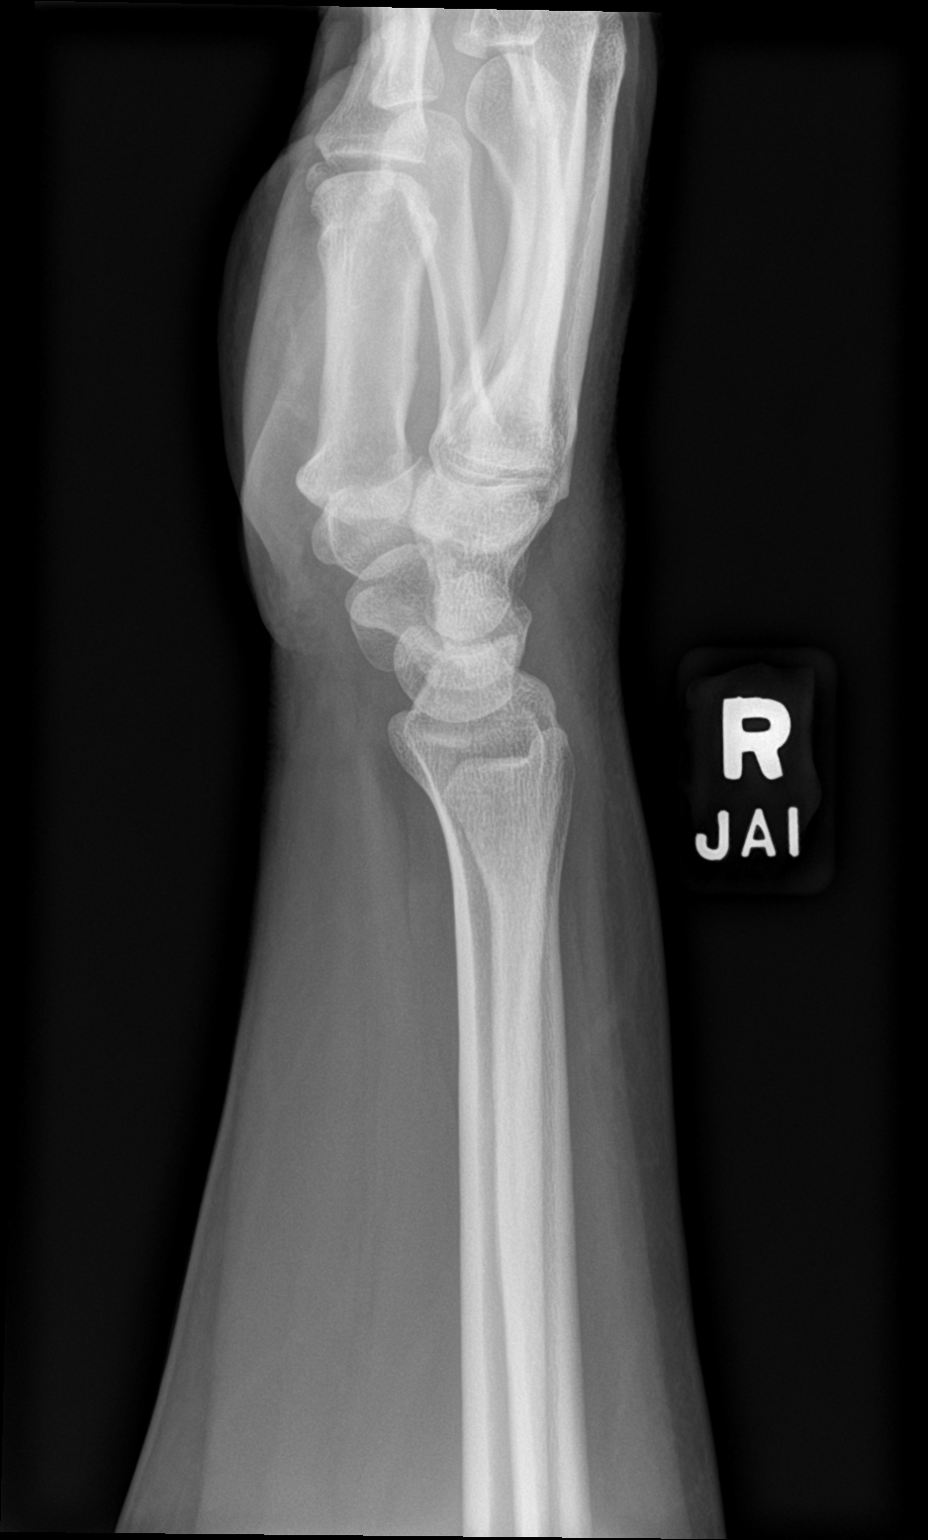

[scaphoid]
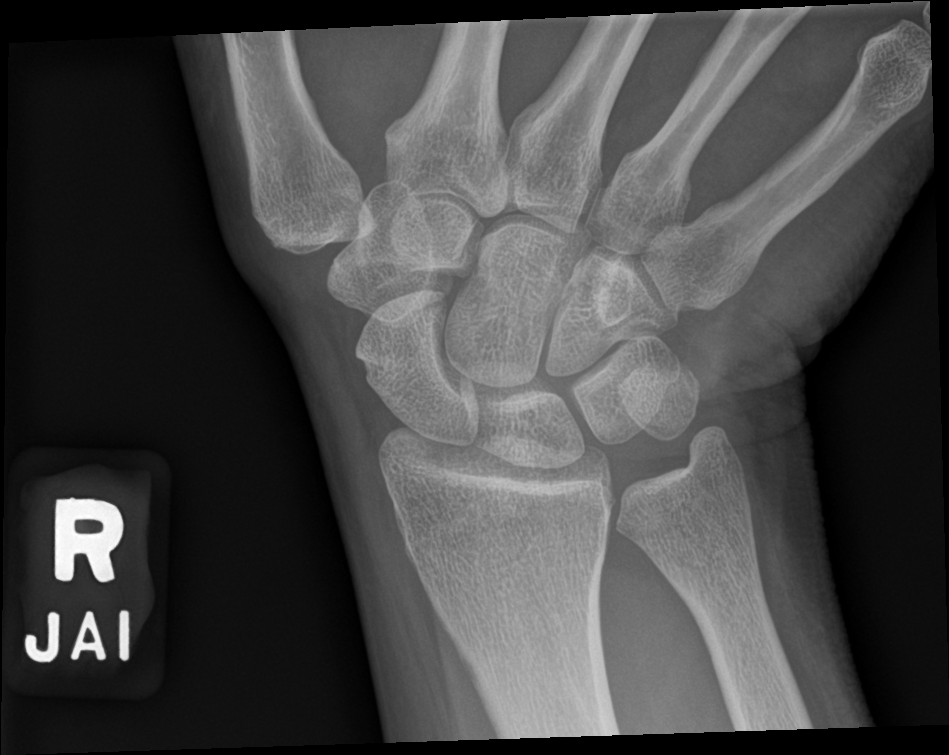

[4 of 4 positions shown; findings below may reference images not displayed]

FINDINGS: There is no evidence of fracture or dislocation. There is no
evidence of arthropathy or other focal bone abnormality. Soft
tissues are unremarkable.
IMPRESSION: Negative.

## 2022-01-18 ENCOUNTER — Other Ambulatory Visit: Payer: Self-pay | Admitting: Obstetrics and Gynecology

## 2022-01-18 DIAGNOSIS — F39 Unspecified mood [affective] disorder: Secondary | ICD-10-CM

## 2022-03-02 ENCOUNTER — Other Ambulatory Visit: Payer: Self-pay | Admitting: Obstetrics and Gynecology

## 2022-03-02 DIAGNOSIS — F39 Unspecified mood [affective] disorder: Secondary | ICD-10-CM

## 2022-03-18 DIAGNOSIS — M6281 Muscle weakness (generalized): Secondary | ICD-10-CM | POA: Diagnosis not present

## 2022-03-18 DIAGNOSIS — M25551 Pain in right hip: Secondary | ICD-10-CM | POA: Diagnosis not present

## 2022-03-18 DIAGNOSIS — M62838 Other muscle spasm: Secondary | ICD-10-CM | POA: Diagnosis not present

## 2022-03-18 DIAGNOSIS — M545 Low back pain, unspecified: Secondary | ICD-10-CM | POA: Diagnosis not present

## 2022-03-25 DIAGNOSIS — M6281 Muscle weakness (generalized): Secondary | ICD-10-CM | POA: Diagnosis not present

## 2022-03-25 DIAGNOSIS — M25551 Pain in right hip: Secondary | ICD-10-CM | POA: Diagnosis not present

## 2022-03-25 DIAGNOSIS — M545 Low back pain, unspecified: Secondary | ICD-10-CM | POA: Diagnosis not present

## 2022-03-25 DIAGNOSIS — M62838 Other muscle spasm: Secondary | ICD-10-CM | POA: Diagnosis not present

## 2022-03-30 DIAGNOSIS — M6281 Muscle weakness (generalized): Secondary | ICD-10-CM | POA: Diagnosis not present

## 2022-03-30 DIAGNOSIS — M62838 Other muscle spasm: Secondary | ICD-10-CM | POA: Diagnosis not present

## 2022-03-30 DIAGNOSIS — M25551 Pain in right hip: Secondary | ICD-10-CM | POA: Diagnosis not present

## 2022-03-30 DIAGNOSIS — M545 Low back pain, unspecified: Secondary | ICD-10-CM | POA: Diagnosis not present

## 2022-04-06 DIAGNOSIS — M25551 Pain in right hip: Secondary | ICD-10-CM | POA: Diagnosis not present

## 2022-04-06 DIAGNOSIS — M62838 Other muscle spasm: Secondary | ICD-10-CM | POA: Diagnosis not present

## 2022-04-06 DIAGNOSIS — M545 Low back pain, unspecified: Secondary | ICD-10-CM | POA: Diagnosis not present

## 2022-04-06 DIAGNOSIS — M6281 Muscle weakness (generalized): Secondary | ICD-10-CM | POA: Diagnosis not present

## 2022-04-13 NOTE — Progress Notes (Deleted)
PCP: Patient, No Pcp Per   No chief complaint on file.    HPI:      Ms. Kristine Ochoa is a 46 y.o. [phone] whose LMP was No LMP recorded., presents today for her annual examination.  Her menses are regular every 28-30 days, lasting 4-7 days, 1 heavy day with clots, but this is improved over the years. Used to be even heavier.  Dysmenorrhea none. She does have intermenstrual bleeding. She does have regular night sweats.   Sex activity: single partner, contraception - vasectomy. She does not have vaginal dryness.  Last Pap:  12/22/17 Results were: no abnormalities /neg HPV DNA. No hx of abn paps.  Last mammogram: 02/24/21 Results were: normal--routine follow-up in 12 months There is no FH of breast cancer. There is no FH of ovarian cancer. The patient does do self-breast exams. Strong FH prostate cancer on her pat side, including her father, but no chemo tx done for any of them or mets that she knows of. Will cont to follow FH.   Colonoscopy: never; Neg Cologuard 8/22, repeat due after 3 yrs  Tobacco use: The patient denies current or previous tobacco use. Alcohol use: social drinker No drug use Exercise: moderately active  She does get adequate calcium and Vitamin D in her diet.  Borderline lipids 2021 and 2022, normal DM screen 3/21. Due for lipid recheck Takes zoloft for mood disorder/PMDD with sx relief. Would like RF  Past Medical History:  Diagnosis Date   Eczema    Mood disorder ([place])    Obesity (BMI 30.0-34.9)    PMDD (premenstrual dysphoric disorder)    Screening for colon cancer 02/2021   NEG Cologuard    Past Surgical History:  Procedure Laterality Date   CESAREAN SECTION  2006   Twins   CHOLECYSTECTOMY     TONSILLECTOMY     WISDOM TOOTH EXTRACTION      Family History  Problem Relation Age of Onset   Atrial fibrillation Mother    Hypertension Mother    Hyperlipidemia Mother    Parkinson's disease Mother 70   Prostate cancer Father 4   Atrial  fibrillation Father    Prostate cancer Paternal Grandfather    Prostate cancer Paternal Uncle     Social History   Socioeconomic History   Marital status: Married    Spouse name: Not on file   Number of children: 3   Years of education: Not on file   Highest education level: Not on file  Occupational History   Not on file  Tobacco Use   Smoking status: Former   Smokeless tobacco: Former    Quit date: 2010  Vaping Use   Vaping Use: Never used  Substance and Sexual Activity   Alcohol use: Yes   Drug use: No   Sexual activity: Yes    Birth control/protection: Other-see comments    Comment: vasectomy  Other Topics Concern   Not on file  Social History Narrative   Not on file   Social Determinants of Health   Financial Resource Strain: Not on file  Food Insecurity: Not on file  Transportation Needs: Not on file  Physical Activity: Sufficiently Active (12/22/2017)   Exercise Vital Sign    Days of Exercise per Week: 5 days    Minutes of Exercise per Session: 40 min  Stress: No Stress Concern Present (12/22/2017)   [employer] [place]    Feeling of Stress : Not at  all  Social Connections: Not on file  Intimate Partner Violence: Not on file     Current Outpatient Medications:    cephALEXin (KEFLEX) 500 MG capsule, Take 500 mg by mouth 2 (two) times daily., Disp: , Rfl:    neomycin-polymyxin b-dexamethasone (MAXITROL) 3.5-10000-0.1 SUSP, Place 1 drop into the right eye 4 (four) times daily., Disp: , Rfl:    sertraline (ZOLOFT) 100 MG tablet, TAKE 1 TABLET BY MOUTH EVERY DAY, Disp: 90 tablet, Rfl: 0     ROS:  Review of Systems  Constitutional:  Negative for fatigue, fever and unexpected weight change.  Respiratory:  Negative for cough, shortness of breath and wheezing.   Cardiovascular:  Negative for chest pain, palpitations and leg swelling.  Gastrointestinal:  Negative for blood in stool, constipation,  diarrhea, nausea and vomiting.  Endocrine: Negative for cold intolerance, heat intolerance and polyuria.  Genitourinary:  Negative for dyspareunia, dysuria, flank pain, frequency, genital sores, hematuria, menstrual problem, pelvic pain, urgency, vaginal bleeding, vaginal discharge and vaginal pain.  Musculoskeletal:  Negative for back pain, joint swelling and myalgias.  Skin:  Negative for rash.  Neurological:  Negative for dizziness, syncope, light-headedness, numbness and headaches.  Hematological:  Negative for adenopathy.  Psychiatric/Behavioral:  Negative for agitation, confusion, sleep disturbance and suicidal ideas. The patient is not nervous/anxious.    BREAST: No symptoms    Objective: There were no vitals taken for this visit.   Physical Exam Constitutional:      Appearance: She is well-developed.  Genitourinary:     Vulva normal.     Right Labia: No rash, tenderness or lesions.    Left Labia: No tenderness, lesions or rash.    No vaginal discharge, erythema or tenderness.      Right Adnexa: not tender and no mass present.    Left Adnexa: not tender and no mass present.    No cervical friability or polyp.     Uterus is not enlarged or tender.  Breasts:    Right: No mass, nipple discharge, skin change or tenderness.     Left: No mass, nipple discharge, skin change or tenderness.  Neck:     Thyroid: No thyromegaly.  Cardiovascular:     Rate and Rhythm: Normal rate and regular rhythm.     Heart sounds: Normal heart sounds. No murmur heard. Pulmonary:     Effort: Pulmonary effort is normal.     Breath sounds: Normal breath sounds.  Abdominal:     Palpations: Abdomen is soft.     Tenderness: There is no abdominal tenderness. There is no guarding or rebound.  Musculoskeletal:        General: Normal range of motion.     Cervical back: Normal range of motion.  Lymphadenopathy:     Cervical: No cervical adenopathy.  Neurological:     General: No focal deficit  present.     Mental Status: She is alert and oriented to person, place, and time.     Cranial Nerves: No cranial nerve deficit.  Skin:    General: Skin is warm and dry.  Psychiatric:        Mood and Affect: Mood normal.        Behavior: Behavior normal.        Thought Content: Thought content normal.        Judgment: Judgment normal.  Vitals reviewed.     Assessment/Plan:  Encounter for annual routine gynecological examination  Encounter for screening mammogram for malignant neoplasm of breast -  Plan: MM 3D SCREEN BREAST BILATERAL; pt to sched mammo  Screening for colon cancer - Plan: Cologuard; colonoscopy/cologuard discussed. Pt elects cologuard. Ref sent. Will f/u with results.  Mood disorder ([facility]) - Plan: sertraline (ZOLOFT) 100 MG tablet; Rx RF. Doing well.   Elevated lipids - Plan: Lipid panel  Screening cholesterol level - Plan: Lipid panel   No orders of the defined types were placed in this encounter.           GYN counsel breast self exam, mammography screening, menopause, adequate intake of calcium and vitamin D, diet and exercise    F/U  No follow-ups on file.  Zaiyden Strozier B. Charlize Hathaway, PA-C 04/13/2022 12:49 PM

## 2022-04-14 ENCOUNTER — Ambulatory Visit: Payer: BC Managed Care – PPO | Admitting: Obstetrics and Gynecology

## 2022-04-14 DIAGNOSIS — Z13220 Encounter for screening for lipoid disorders: Secondary | ICD-10-CM

## 2022-04-14 DIAGNOSIS — Z01419 Encounter for gynecological examination (general) (routine) without abnormal findings: Secondary | ICD-10-CM

## 2022-04-14 DIAGNOSIS — Z0000 Encounter for general adult medical examination without abnormal findings: Secondary | ICD-10-CM

## 2022-04-14 DIAGNOSIS — F39 Unspecified mood [affective] disorder: Secondary | ICD-10-CM

## 2022-04-14 DIAGNOSIS — Z1151 Encounter for screening for human papillomavirus (HPV): Secondary | ICD-10-CM

## 2022-04-14 DIAGNOSIS — Z1231 Encounter for screening mammogram for malignant neoplasm of breast: Secondary | ICD-10-CM

## 2022-04-14 DIAGNOSIS — Z124 Encounter for screening for malignant neoplasm of cervix: Secondary | ICD-10-CM

## 2022-04-27 DIAGNOSIS — M6281 Muscle weakness (generalized): Secondary | ICD-10-CM | POA: Diagnosis not present

## 2022-04-27 DIAGNOSIS — M25551 Pain in right hip: Secondary | ICD-10-CM | POA: Diagnosis not present

## 2022-04-27 DIAGNOSIS — M62838 Other muscle spasm: Secondary | ICD-10-CM | POA: Diagnosis not present

## 2022-04-27 DIAGNOSIS — M545 Low back pain, unspecified: Secondary | ICD-10-CM | POA: Diagnosis not present

## 2022-04-28 ENCOUNTER — Other Ambulatory Visit: Payer: Self-pay | Admitting: Obstetrics and Gynecology

## 2022-04-28 DIAGNOSIS — Z1231 Encounter for screening mammogram for malignant neoplasm of breast: Secondary | ICD-10-CM

## 2022-05-04 DIAGNOSIS — M545 Low back pain, unspecified: Secondary | ICD-10-CM | POA: Diagnosis not present

## 2022-05-04 DIAGNOSIS — M62838 Other muscle spasm: Secondary | ICD-10-CM | POA: Diagnosis not present

## 2022-05-04 DIAGNOSIS — M6281 Muscle weakness (generalized): Secondary | ICD-10-CM | POA: Diagnosis not present

## 2022-05-04 DIAGNOSIS — M25551 Pain in right hip: Secondary | ICD-10-CM | POA: Diagnosis not present

## 2022-05-24 NOTE — Progress Notes (Unsigned)
PCP: Patient, No Pcp Per   No chief complaint on file.    HPI:      Ms. Kristine Ochoa is a 46 y.o. [phone] whose LMP was No LMP recorded., presents today for her annual examination.  Her menses are regular every 28-30 days, lasting 4-7 days, 1 heavy day with clots, but this is improved over the years. Used to be even heavier.  Dysmenorrhea none. She does have intermenstrual bleeding. She does have regular night sweats.   Sex activity: single partner, contraception - vasectomy. She does not have vaginal dryness.  Last Pap:  12/22/17 Results were: no abnormalities /neg HPV DNA. No hx of abn paps.  Last mammogram: 02/24/21  Results were: normal--routine follow-up in 12 months; has 11/23 appt There is no FH of breast cancer. There is no FH of ovarian cancer. The patient does do self-breast exams. Strong FH prostate cancer on her pat side, including her father, but no chemo tx done for any of them or mets that she knows of. Will cont to follow FH.   Colonoscopy: never; 8/22 neg Cologuard, repeat after 3 yrs  Tobacco use: The patient denies current or previous tobacco use. Alcohol use: social drinker No drug use Exercise: moderately active  She does get adequate calcium and Vitamin D in her diet.  Borderline lipids 2022, normal DM screen 3/21. Due for lipid recheck Takes zoloft for mood disorder/PMDD with sx relief. Would like RF  Past Medical History:  Diagnosis Date   Eczema    Mood disorder ([facility])    Obesity (BMI 30.0-34.9)    PMDD (premenstrual dysphoric disorder)    Screening for colon cancer 02/2021   NEG Cologuard    Past Surgical History:  Procedure Laterality Date   CESAREAN SECTION  2006   Twins   CHOLECYSTECTOMY     TONSILLECTOMY     WISDOM TOOTH EXTRACTION      Family History  Problem Relation Age of Onset   Atrial fibrillation Mother    Hypertension Mother    Hyperlipidemia Mother    Parkinson's disease Mother 87   Prostate cancer Father 58   Atrial  fibrillation Father    Prostate cancer Paternal Grandfather    Prostate cancer Paternal Uncle     Social History   Socioeconomic History   Marital status: Married    Spouse name: Not on file   Number of children: 3   Years of education: Not on file   Highest education level: Not on file  Occupational History   Not on file  Tobacco Use   Smoking status: Former   Smokeless tobacco: Former    Quit date: 2010  Vaping Use   Vaping Use: Never used  Substance and Sexual Activity   Alcohol use: Yes   Drug use: No   Sexual activity: Yes    Birth control/protection: Other-see comments    Comment: vasectomy  Other Topics Concern   Not on file  Social History Narrative   Not on file   Social Determinants of Health   Financial Resource Strain: Not on file  Food Insecurity: Not on file  Transportation Needs: Not on file  Physical Activity: Sufficiently Active (12/22/2017)   Exercise Vital Sign    Days of Exercise per Week: 5 days    Minutes of Exercise per Session: 40 min  Stress: No Stress Concern Present (12/22/2017)   [name] of Occupational Health - Occupational Stress Questionnaire    Feeling of Stress : Not  at all  Social Connections: Not on file  Intimate Partner Violence: Not on file     Current Outpatient Medications:    cephALEXin (KEFLEX) 500 MG capsule, Take 500 mg by mouth 2 (two) times daily., Disp: , Rfl:    neomycin-polymyxin b-dexamethasone (MAXITROL) 3.5-10000-0.1 SUSP, Place 1 drop into the right eye 4 (four) times daily., Disp: , Rfl:    sertraline (ZOLOFT) 100 MG tablet, TAKE 1 TABLET BY MOUTH EVERY DAY, Disp: 90 tablet, Rfl: 0     ROS:  Review of Systems  Constitutional:  Negative for fatigue, fever and unexpected weight change.  Respiratory:  Negative for cough, shortness of breath and wheezing.   Cardiovascular:  Negative for chest pain, palpitations and leg swelling.  Gastrointestinal:  Negative for blood in stool, constipation,  diarrhea, nausea and vomiting.  Endocrine: Negative for cold intolerance, heat intolerance and polyuria.  Genitourinary:  Negative for dyspareunia, dysuria, flank pain, frequency, genital sores, hematuria, menstrual problem, pelvic pain, urgency, vaginal bleeding, vaginal discharge and vaginal pain.  Musculoskeletal:  Negative for back pain, joint swelling and myalgias.  Skin:  Negative for rash.  Neurological:  Negative for dizziness, syncope, light-headedness, numbness and headaches.  Hematological:  Negative for adenopathy.  Psychiatric/Behavioral:  Negative for agitation, confusion, sleep disturbance and suicidal ideas. The patient is not nervous/anxious.    BREAST: No symptoms    Objective: There were no vitals taken for this visit.   Physical Exam Constitutional:      Appearance: She is well-developed.  Genitourinary:     Vulva normal.     Right Labia: No rash, tenderness or lesions.    Left Labia: No tenderness, lesions or rash.    No vaginal discharge, erythema or tenderness.      Right Adnexa: not tender and no mass present.    Left Adnexa: not tender and no mass present.    No cervical friability or polyp.     Uterus is not enlarged or tender.  Breasts:    Right: No mass, nipple discharge, skin change or tenderness.     Left: No mass, nipple discharge, skin change or tenderness.  Neck:     Thyroid: No thyromegaly.  Cardiovascular:     Rate and Rhythm: Normal rate and regular rhythm.     Heart sounds: Normal heart sounds. No murmur heard. Pulmonary:     Effort: Pulmonary effort is normal.     Breath sounds: Normal breath sounds.  Abdominal:     Palpations: Abdomen is soft.     Tenderness: There is no abdominal tenderness. There is no guarding or rebound.  Musculoskeletal:        General: Normal range of motion.     Cervical back: Normal range of motion.  Lymphadenopathy:     Cervical: No cervical adenopathy.  Neurological:     General: No focal deficit  present.     Mental Status: She is alert and oriented to person, place, and time.     Cranial Nerves: No cranial nerve deficit.  Skin:    General: Skin is warm and dry.  Psychiatric:        Mood and Affect: Mood normal.        Behavior: Behavior normal.        Thought Content: Thought content normal.        Judgment: Judgment normal.  Vitals reviewed.     Assessment/Plan:  Encounter for annual routine gynecological examination  Encounter for screening mammogram for malignant neoplasm of breast -  Plan: MM 3D SCREEN BREAST BILATERAL; pt to sched mammo  Screening for colon cancer - Plan: Cologuard; colonoscopy/cologuard discussed. Pt elects cologuard. Ref sent. Will f/u with results.  Mood disorder ([facility]CC) - Plan: sertraline (ZOLOFT) 100 MG tablet; Rx RF. Doing well.   Elevated lipids - Plan: Lipid panel  Screening cholesterol level - Plan: Lipid panel   No orders of the defined types were placed in this encounter.           GYN counsel breast self exam, mammography screening, menopause, adequate intake of calcium and vitamin D, diet and exercise    F/U  No follow-ups on file.  Ashby Leflore B. Moiz Ryant, PA-C 05/24/2022 8:36 PM

## 2022-05-25 ENCOUNTER — Encounter: Payer: Self-pay | Admitting: Obstetrics and Gynecology

## 2022-05-25 ENCOUNTER — Other Ambulatory Visit (HOSPITAL_COMMUNITY)
Admission: RE | Admit: 2022-05-25 | Discharge: 2022-05-25 | Disposition: A | Discharge status: Home / Self Care | Payer: BC Managed Care – PPO | Source: Ambulatory Visit | Attending: Obstetrics and Gynecology | Admitting: Obstetrics and Gynecology

## 2022-05-25 ENCOUNTER — Ambulatory Visit (INDEPENDENT_AMBULATORY_CARE_PROVIDER_SITE_OTHER): Payer: BC Managed Care – PPO | Admitting: Obstetrics and Gynecology

## 2022-05-25 VITALS — BP 100/70 | Ht 71.0 in | Wt 245.0 lb

## 2022-05-25 DIAGNOSIS — Z1151 Encounter for screening for human papillomavirus (HPV): Secondary | ICD-10-CM | POA: Diagnosis not present

## 2022-05-25 DIAGNOSIS — Z01411 Encounter for gynecological examination (general) (routine) with abnormal findings: Secondary | ICD-10-CM | POA: Diagnosis not present

## 2022-05-25 DIAGNOSIS — Z124 Encounter for screening for malignant neoplasm of cervix: Secondary | ICD-10-CM

## 2022-05-25 DIAGNOSIS — E785 Hyperlipidemia, unspecified: Secondary | ICD-10-CM

## 2022-05-25 DIAGNOSIS — Z13220 Encounter for screening for lipoid disorders: Secondary | ICD-10-CM

## 2022-05-25 DIAGNOSIS — Z01419 Encounter for gynecological examination (general) (routine) without abnormal findings: Secondary | ICD-10-CM

## 2022-05-25 DIAGNOSIS — F39 Unspecified mood [affective] disorder: Secondary | ICD-10-CM

## 2022-05-25 DIAGNOSIS — Z1231 Encounter for screening mammogram for malignant neoplasm of breast: Secondary | ICD-10-CM

## 2022-05-25 MED ORDER — SERTRALINE HCL 100 MG PO TABS: 100.0000 mg | ORAL_TABLET | Freq: Every day | ORAL | 3 refills | Status: DC

## 2022-05-25 NOTE — Patient Instructions (Signed)
I value your feedback and you entrusting us with your care. If you get a [facility] patient survey, I would appreciate you taking the time to let us know about your experience today. Thank you!  [facility]: [phone]      

## 2022-05-27 LAB — CYTOLOGY - PAP
Comment: NEGATIVE
Diagnosis: NEGATIVE
High risk HPV: NEGATIVE

## 2022-06-04 ENCOUNTER — Ambulatory Visit
Admission: RE | Admit: 2022-06-04 | Discharge: 2022-06-04 | Disposition: A | Discharge status: Home / Self Care | Payer: BC Managed Care – PPO | Source: Ambulatory Visit | Attending: Obstetrics and Gynecology | Admitting: Obstetrics and Gynecology

## 2022-06-04 DIAGNOSIS — Z1231 Encounter for screening mammogram for malignant neoplasm of breast: Secondary | ICD-10-CM | POA: Diagnosis not present

## 2022-06-09 ENCOUNTER — Other Ambulatory Visit: Payer: BC Managed Care – PPO

## 2022-06-09 DIAGNOSIS — Z13220 Encounter for screening for lipoid disorders: Secondary | ICD-10-CM | POA: Diagnosis not present

## 2022-06-09 DIAGNOSIS — E785 Hyperlipidemia, unspecified: Secondary | ICD-10-CM

## 2022-06-10 LAB — LIPID PANEL
Chol/HDL Ratio: 3.4 ratio (ref 0.0–4.4)
Cholesterol, Total: 212 mg/dL — ABNORMAL HIGH (ref 100–199)
HDL: 62 mg/dL (ref >39)
LDL Chol Calc (NIH): 124 mg/dL — ABNORMAL HIGH (ref 0–99)
Triglycerides: 150 mg/dL — ABNORMAL HIGH (ref 0–149)
VLDL Cholesterol Cal: 26 mg/dL (ref 5–40)

## 2022-07-15 DIAGNOSIS — L821 Other seborrheic keratosis: Secondary | ICD-10-CM | POA: Diagnosis not present

## 2022-07-15 DIAGNOSIS — D2371 Other benign neoplasm of skin of right lower limb, including hip: Secondary | ICD-10-CM | POA: Diagnosis not present

## 2022-07-15 DIAGNOSIS — L578 Other skin changes due to chronic exposure to nonionizing radiation: Secondary | ICD-10-CM | POA: Diagnosis not present

## 2022-07-15 DIAGNOSIS — L2089 Other atopic dermatitis: Secondary | ICD-10-CM | POA: Diagnosis not present

## 2023-04-06 DIAGNOSIS — L309 Dermatitis, unspecified: Secondary | ICD-10-CM | POA: Diagnosis not present

## 2023-04-29 ENCOUNTER — Telehealth: Payer: Self-pay | Admitting: Obstetrics and Gynecology

## 2023-04-29 NOTE — Telephone Encounter (Signed)
LM for patient to call office back to schedule annual appt with [facility] after 05/26/23.

## 2023-07-20 ENCOUNTER — Other Ambulatory Visit: Payer: Self-pay | Admitting: Obstetrics and Gynecology

## 2023-07-20 DIAGNOSIS — Z1231 Encounter for screening mammogram for malignant neoplasm of breast: Secondary | ICD-10-CM

## 2023-07-30 ENCOUNTER — Ambulatory Visit
Admission: RE | Admit: 2023-07-30 | Discharge: 2023-07-30 | Disposition: A | Discharge status: Home / Self Care | Payer: BC Managed Care – PPO | Source: Ambulatory Visit | Attending: Obstetrics and Gynecology | Admitting: Obstetrics and Gynecology

## 2023-07-30 DIAGNOSIS — Z1231 Encounter for screening mammogram for malignant neoplasm of breast: Secondary | ICD-10-CM | POA: Diagnosis not present

## 2023-08-02 ENCOUNTER — Other Ambulatory Visit: Payer: Self-pay | Admitting: Obstetrics and Gynecology

## 2023-08-02 ENCOUNTER — Encounter: Payer: Self-pay | Admitting: Obstetrics and Gynecology

## 2023-08-02 DIAGNOSIS — R928 Other abnormal and inconclusive findings on diagnostic imaging of breast: Secondary | ICD-10-CM

## 2023-08-04 ENCOUNTER — Ambulatory Visit
Admission: RE | Admit: 2023-08-04 | Discharge: 2023-08-04 | Disposition: A | Discharge status: Home / Self Care | Payer: BC Managed Care – PPO | Source: Ambulatory Visit | Attending: Obstetrics and Gynecology | Admitting: Obstetrics and Gynecology

## 2023-08-04 DIAGNOSIS — R928 Other abnormal and inconclusive findings on diagnostic imaging of breast: Secondary | ICD-10-CM | POA: Insufficient documentation

## 2023-08-04 DIAGNOSIS — R92333 Mammographic heterogeneous density, bilateral breasts: Secondary | ICD-10-CM | POA: Diagnosis not present

## 2023-08-05 ENCOUNTER — Encounter: Payer: Self-pay | Admitting: Obstetrics and Gynecology

## 2023-08-23 NOTE — Progress Notes (Deleted)
 PCP: Patient, No Pcp Per   No chief complaint on file.    HPI:      Kristine Ochoa is a 48 y.o. [phone] whose LMP was No LMP recorded., presents today for her annual examination.  Her menses are regular every 28-30 days, lasting 6 days, 1 heavy day with clots, but this is improved over the years. Used to be even heavier.  Dysmenorrhea none. She does not have intermenstrual bleeding.   Sex activity: single partner, contraception - vasectomy. She does not have vaginal dryness. No pain/bleeding.   Last Pap:  05/25/22 Results were: no abnormalities /neg HPV DNA. No hx of abn paps.  Last mammogram: 08/04/23  Results were: normal--routine follow-up in 12 months There is no FH of breast cancer. There is no FH of ovarian cancer. The patient does do self-breast exams. Strong FH prostate cancer on her pat side, including her father, but no chemo tx done for any of them or mets that she knows of. Will cont to follow FH.   Colonoscopy: never; 8/22 neg Cologuard, repeat after 3 yrs  Tobacco use: The patient denies current or previous tobacco use. Alcohol use: social drinker No drug use Exercise: moderately active  She does get adequate calcium but not Vitamin D in her diet.  Borderline lipids 2022 and 2023, normal DM screen 3/21.  Takes zoloft 100 mg for mood disorder/PMDD with sx relief. Been on it since 20s; she's not sure if she should come off.  Past Medical History:  Diagnosis Date   Eczema    Mood disorder ([facility])    Obesity (BMI 30.0-34.9)    PMDD (premenstrual dysphoric disorder)    Screening for colon cancer 02/2021   NEG Cologuard    Past Surgical History:  Procedure Laterality Date   CESAREAN SECTION  2006   Twins   CHOLECYSTECTOMY     TONSILLECTOMY     WISDOM TOOTH EXTRACTION      Family History  Problem Relation Age of Onset   Atrial fibrillation Mother    Hypertension Mother    Hyperlipidemia Mother    Parkinson's disease Mother 90   Prostate cancer  Father 20   Atrial fibrillation Father    Prostate cancer Paternal Grandfather    Prostate cancer Paternal Uncle     Social History   Socioeconomic History   Marital status: Married    Spouse name: Not on file   Number of children: 3   Years of education: Not on file   Highest education level: Not on file  Occupational History   Not on file  Tobacco Use   Smoking status: Former   Smokeless tobacco: Former    Quit date: 2010  Vaping Use   Vaping status: Never Used  Substance and Sexual Activity   Alcohol use: Yes   Drug use: No   Sexual activity: Yes    Birth control/protection: Other-see comments    Comment: vasectomy  Other Topics Concern   Not on file  Social History Narrative   Not on file   Social Drivers of Health   Financial Resource Strain: Not on file  Food Insecurity: Not on file  Transportation Needs: Not on file  Physical Activity: Sufficiently Active (12/22/2017)   Exercise Vital Sign    Days of Exercise per Week: 5 days    Minutes of Exercise per Session: 40 min  Stress: No Stress Concern Present (12/22/2017)   [name] of Occupational Health - Occupational Stress Questionnaire  Feeling of Stress : Not at all  Social Connections: Not on file  Intimate Partner Violence: Not on file     Current Outpatient Medications:    sertraline (ZOLOFT) 100 MG tablet, Take 1 tablet (100 mg total) by mouth daily., Disp: 90 tablet, Rfl: 3     ROS:  Review of Systems  Constitutional:  Negative for fatigue, fever and unexpected weight change.  Respiratory:  Negative for cough, shortness of breath and wheezing.   Cardiovascular:  Negative for chest pain, palpitations and leg swelling.  Gastrointestinal:  Negative for blood in stool, constipation, diarrhea, nausea and vomiting.  Endocrine: Negative for cold intolerance, heat intolerance and polyuria.  Genitourinary:  Negative for dyspareunia, dysuria, flank pain, frequency, genital sores, hematuria,  menstrual problem, pelvic pain, urgency, vaginal bleeding, vaginal discharge and vaginal pain.  Musculoskeletal:  Negative for back pain, joint swelling and myalgias.  Skin:  Negative for rash.  Neurological:  Negative for dizziness, syncope, light-headedness, numbness and headaches.  Hematological:  Negative for adenopathy.  Psychiatric/Behavioral:  Negative for agitation, confusion, sleep disturbance and suicidal ideas. The patient is not nervous/anxious.    BREAST: No symptoms    Objective: There were no vitals taken for this visit.   Physical Exam Constitutional:      Appearance: She is well-developed.  Genitourinary:     Vulva normal.     Right Labia: No rash, tenderness or lesions.    Left Labia: No tenderness, lesions or rash.    No vaginal discharge, erythema or tenderness.      Right Adnexa: not tender and no mass present.    Left Adnexa: not tender and no mass present.    No cervical friability or polyp.     Uterus is not enlarged or tender.  Breasts:    Right: No mass, nipple discharge, skin change or tenderness.     Left: No mass, nipple discharge, skin change or tenderness.  Neck:     Thyroid: No thyromegaly.  Cardiovascular:     Rate and Rhythm: Normal rate and regular rhythm.     Heart sounds: Normal heart sounds. No murmur heard. Pulmonary:     Effort: Pulmonary effort is normal.     Breath sounds: Normal breath sounds.  Abdominal:     Palpations: Abdomen is soft.     Tenderness: There is no abdominal tenderness. There is no guarding or rebound.  Musculoskeletal:        General: Normal range of motion.     Cervical back: Normal range of motion.  Lymphadenopathy:     Cervical: No cervical adenopathy.  Neurological:     General: No focal deficit present.     Mental Status: She is alert and oriented to person, place, and time.     Cranial Nerves: No cranial nerve deficit.  Skin:    General: Skin is warm and dry.  Psychiatric:        Mood and Affect:  Mood normal.        Behavior: Behavior normal.        Thought Content: Thought content normal.        Judgment: Judgment normal.  Vitals reviewed.     Assessment/Plan:  Encounter for annual routine gynecological examination  Cervical cancer screening - Plan: Cytology - PAP  Screening for HPV (human papillomavirus) - Plan: Cytology - PAP  Encounter for screening mammogram for malignant neoplasm of breast; pt has appt scheduled.   Mood disorder (HCC) - Plan: sertraline (ZOLOFT) 100 MG  tablet; Rx RF zoloft. Can try to decrease to 50 mg dose to see how she feels. Can then wean off is she wants.   Elevated lipids - Plan: Lipid panel; schedule fasting lab appt.   Screening cholesterol level - Plan: Lipid panel    No orders of the defined types were placed in this encounter.           GYN counsel breast self exam, mammography screening, menopause, adequate intake of calcium and vitamin D, diet and exercise    F/U  No follow-ups on file.  Tajae Rybicki B. [place], PA-C 08/23/2023 4:31 PM

## 2023-08-24 ENCOUNTER — Ambulatory Visit: Payer: BC Managed Care – PPO | Admitting: Obstetrics and Gynecology

## 2023-08-24 ENCOUNTER — Other Ambulatory Visit: Payer: Self-pay | Admitting: Obstetrics and Gynecology

## 2023-08-24 ENCOUNTER — Telehealth: Payer: Self-pay

## 2023-08-24 DIAGNOSIS — Z1211 Encounter for screening for malignant neoplasm of colon: Secondary | ICD-10-CM

## 2023-08-24 DIAGNOSIS — Z1231 Encounter for screening mammogram for malignant neoplasm of breast: Secondary | ICD-10-CM

## 2023-08-24 DIAGNOSIS — F39 Unspecified mood [affective] disorder: Secondary | ICD-10-CM

## 2023-08-24 DIAGNOSIS — Z01419 Encounter for gynecological examination (general) (routine) without abnormal findings: Secondary | ICD-10-CM

## 2023-08-24 MED ORDER — SERTRALINE HCL 100 MG PO TABS: 100.0000 mg | ORAL_TABLET | Freq: Every day | ORAL | 0 refills | Status: DC

## 2023-08-24 NOTE — Telephone Encounter (Signed)
Rx RF eRxd.  

## 2023-08-24 NOTE — Telephone Encounter (Signed)
 Pt called triage requesting a refill on Zoloft, she was scheduled to see you today. She has 5 pills left.

## 2023-08-24 NOTE — Telephone Encounter (Signed)
 Called pt, no answer, left detailed msg Rx RF sent.

## 2023-08-24 NOTE — Progress Notes (Signed)
Rx RF sertraline till annual 

## 2023-09-19 NOTE — Progress Notes (Unsigned)
 PCP: Patient, No Pcp Per   No chief complaint on file.    HPI:      Ms. Kristine Ochoa is a 48 y.o. [phone] whose LMP was No LMP recorded., presents today for her annual examination.  Her menses are regular every 28-30 days, lasting 6 days, 1 heavy day with clots, but this is improved over the years. Used to be even heavier.  Dysmenorrhea none. She does not have intermenstrual bleeding.   Sex activity: single partner, contraception - vasectomy. She does not have vaginal dryness. No pain/bleeding.   Last Pap:  05/25/22 Results were: no abnormalities /neg HPV DNA. No hx of abn paps.  Last mammogram: 08/04/23  Results were: normal--routine follow-up in 12 months There is no FH of breast cancer. There is no FH of ovarian cancer. The patient does do self-breast exams. Strong FH prostate cancer on her pat side, including her father, but no chemo tx done for any of them or mets that she knows of. Will cont to follow FH.   Colonoscopy: never; 8/22 neg Cologuard, repeat after 3 yrs  Tobacco use: The patient denies current or previous tobacco use. Alcohol use: social drinker No drug use Exercise: moderately active  She does get adequate calcium but not Vitamin D in her diet.  Borderline lipids 2022 and 2023, normal DM screen 3/21.  Takes zoloft 100 mg for mood disorder/PMDD with sx relief. Been on it since 20s; she's not sure if she should come off.  Past Medical History:  Diagnosis Date   Eczema    Mood disorder (HCC)    Obesity (BMI 30.0-34.9)    PMDD (premenstrual dysphoric disorder)    Screening for colon cancer 02/2021   NEG Cologuard    Past Surgical History:  Procedure Laterality Date   CESAREAN SECTION  2006   Twins   CHOLECYSTECTOMY     TONSILLECTOMY     WISDOM TOOTH EXTRACTION      Family History  Problem Relation Age of Onset   Atrial fibrillation Mother    Hypertension Mother    Hyperlipidemia Mother    Parkinson's disease Mother 46   Prostate cancer  Father 4   Atrial fibrillation Father    Prostate cancer Paternal Grandfather    Prostate cancer Paternal Uncle     Social History   Socioeconomic History   Marital status: Married    Spouse name: Not on file   Number of children: 3   Years of education: Not on file   Highest education level: Not on file  Occupational History   Not on file  Tobacco Use   Smoking status: Former   Smokeless tobacco: Former    Quit date: 2010  Vaping Use   Vaping status: Never Used  Substance and Sexual Activity   Alcohol use: Yes   Drug use: No   Sexual activity: Yes    Birth control/protection: Other-see comments    Comment: vasectomy  Other Topics Concern   Not on file  Social History Narrative   Not on file   Social Drivers of Health   Financial Resource Strain: Not on file  Food Insecurity: Not on file  Transportation Needs: Not on file  Physical Activity: Sufficiently Active (12/22/2017)   Exercise Vital Sign    Days of Exercise per Week: 5 days    Minutes of Exercise per Session: 40 min  Stress: No Stress Concern Present (12/22/2017)   [name] of Occupational Health - Occupational Stress Questionnaire  Feeling of Stress : Not at all  Social Connections: Not on file  Intimate Partner Violence: Not on file     Current Outpatient Medications:    sertraline (ZOLOFT) 100 MG tablet, Take 1 tablet (100 mg total) by mouth daily., Disp: 90 tablet, Rfl: 0     ROS:  Review of Systems  Constitutional:  Negative for fatigue, fever and unexpected weight change.  Respiratory:  Negative for cough, shortness of breath and wheezing.   Cardiovascular:  Negative for chest pain, palpitations and leg swelling.  Gastrointestinal:  Negative for blood in stool, constipation, diarrhea, nausea and vomiting.  Endocrine: Negative for cold intolerance, heat intolerance and polyuria.  Genitourinary:  Negative for dyspareunia, dysuria, flank pain, frequency, genital sores, hematuria,  menstrual problem, pelvic pain, urgency, vaginal bleeding, vaginal discharge and vaginal pain.  Musculoskeletal:  Negative for back pain, joint swelling and myalgias.  Skin:  Negative for rash.  Neurological:  Negative for dizziness, syncope, light-headedness, numbness and headaches.  Hematological:  Negative for adenopathy.  Psychiatric/Behavioral:  Negative for agitation, confusion, sleep disturbance and suicidal ideas. The patient is not nervous/anxious.    BREAST: No symptoms    Objective: There were no vitals taken for this visit.   Physical Exam Constitutional:      Appearance: She is well-developed.  Genitourinary:     Vulva normal.     Right Labia: No rash, tenderness or lesions.    Left Labia: No tenderness, lesions or rash.    No vaginal discharge, erythema or tenderness.      Right Adnexa: not tender and no mass present.    Left Adnexa: not tender and no mass present.    No cervical friability or polyp.     Uterus is not enlarged or tender.  Breasts:    Right: No mass, nipple discharge, skin change or tenderness.     Left: No mass, nipple discharge, skin change or tenderness.  Neck:     Thyroid: No thyromegaly.  Cardiovascular:     Rate and Rhythm: Normal rate and regular rhythm.     Heart sounds: Normal heart sounds. No murmur heard. Pulmonary:     Effort: Pulmonary effort is normal.     Breath sounds: Normal breath sounds.  Abdominal:     Palpations: Abdomen is soft.     Tenderness: There is no abdominal tenderness. There is no guarding or rebound.  Musculoskeletal:        General: Normal range of motion.     Cervical back: Normal range of motion.  Lymphadenopathy:     Cervical: No cervical adenopathy.  Neurological:     General: No focal deficit present.     Mental Status: She is alert and oriented to person, place, and time.     Cranial Nerves: No cranial nerve deficit.  Skin:    General: Skin is warm and dry.  Psychiatric:        Mood and Affect:  Mood normal.        Behavior: Behavior normal.        Thought Content: Thought content normal.        Judgment: Judgment normal.  Vitals reviewed.     Assessment/Plan:  Encounter for annual routine gynecological examination  Cervical cancer screening - Plan: Cytology - PAP  Screening for HPV (human papillomavirus) - Plan: Cytology - PAP  Encounter for screening mammogram for malignant neoplasm of breast; pt has appt scheduled.   Mood disorder (HCC) - Plan: sertraline (ZOLOFT) 100 MG  tablet; Rx RF zoloft. Can try to decrease to 50 mg dose to see how she feels. Can then wean off is she wants.   Elevated lipids - Plan: Lipid panel; schedule fasting lab appt.   Screening cholesterol level - Plan: Lipid panel    No orders of the defined types were placed in this encounter.           GYN counsel breast self exam, mammography screening, menopause, adequate intake of calcium and vitamin D, diet and exercise    F/U  No follow-ups on file.  Eriel Doyon B. Naliah Eddington, PA-C 09/19/2023 8:56 AM

## 2023-09-21 ENCOUNTER — Encounter: Payer: Self-pay | Admitting: Obstetrics and Gynecology

## 2023-09-21 ENCOUNTER — Ambulatory Visit (INDEPENDENT_AMBULATORY_CARE_PROVIDER_SITE_OTHER): Payer: BC Managed Care – PPO | Admitting: Obstetrics and Gynecology

## 2023-09-21 VITALS — BP 110/80 | Ht 70.0 in | Wt 246.0 lb

## 2023-09-21 DIAGNOSIS — E785 Hyperlipidemia, unspecified: Secondary | ICD-10-CM | POA: Diagnosis not present

## 2023-09-21 DIAGNOSIS — Z1329 Encounter for screening for other suspected endocrine disorder: Secondary | ICD-10-CM

## 2023-09-21 DIAGNOSIS — Z0000 Encounter for general adult medical examination without abnormal findings: Secondary | ICD-10-CM

## 2023-09-21 DIAGNOSIS — Z1231 Encounter for screening mammogram for malignant neoplasm of breast: Secondary | ICD-10-CM

## 2023-09-21 DIAGNOSIS — Z01419 Encounter for gynecological examination (general) (routine) without abnormal findings: Secondary | ICD-10-CM

## 2023-09-21 DIAGNOSIS — R5383 Other fatigue: Secondary | ICD-10-CM | POA: Diagnosis not present

## 2023-09-21 DIAGNOSIS — Z1211 Encounter for screening for malignant neoplasm of colon: Secondary | ICD-10-CM

## 2023-09-21 DIAGNOSIS — F39 Unspecified mood [affective] disorder: Secondary | ICD-10-CM

## 2023-09-21 MED ORDER — SERTRALINE HCL 100 MG PO TABS: 100.0000 mg | ORAL_TABLET | Freq: Every day | ORAL | 3 refills | Status: AC

## 2023-09-21 NOTE — Patient Instructions (Signed)
 I value your feedback and you entrusting [place] with your care. If you get a [place] patient survey, I would appreciate you taking the time to let us know about your experience today. Thank you! ? ? ?

## 2023-09-22 LAB — CBC WITH DIFFERENTIAL/PLATELET
Basophils Absolute: 0.1 10*3/uL (ref 0.0–0.2)
Basos: 1 %
EOS (ABSOLUTE): 0.4 10*3/uL (ref 0.0–0.4)
Eos: 5 %
Hematocrit: 44.8 % (ref 34.0–46.6)
Hemoglobin: 14.5 g/dL (ref 11.1–15.9)
Immature Grans (Abs): 0 10*3/uL (ref 0.0–0.1)
Immature Granulocytes: 0 %
Lymphocytes Absolute: 1.6 10*3/uL (ref 0.7–3.1)
Lymphs: 22 %
MCH: 30.1 pg (ref 26.6–33.0)
MCHC: 32.4 g/dL (ref 31.5–35.7)
MCV: 93 fL (ref 79–97)
Monocytes Absolute: 0.5 10*3/uL (ref 0.1–0.9)
Monocytes: 8 %
Neutrophils Absolute: 4.6 10*3/uL (ref 1.4–7.0)
Neutrophils: 64 %
Platelets: 234 10*3/uL (ref 150–450)
RBC: 4.82 x10E6/uL (ref 3.77–5.28)
RDW: 12.9 % (ref 11.7–15.4)
WBC: 7.2 10*3/uL (ref 3.4–10.8)

## 2023-09-22 LAB — T4, FREE: Free T4: 1 ng/dL (ref 0.82–1.77)

## 2023-09-22 LAB — LIPID PANEL
Chol/HDL Ratio: 3.6 ratio (ref 0.0–4.4)
Cholesterol, Total: 246 mg/dL — ABNORMAL HIGH (ref 100–199)
HDL: 68 mg/dL (ref >39)
LDL Chol Calc (NIH): 150 mg/dL — ABNORMAL HIGH (ref 0–99)
Triglycerides: 159 mg/dL — ABNORMAL HIGH (ref 0–149)
VLDL Cholesterol Cal: 28 mg/dL (ref 5–40)

## 2023-09-22 LAB — COMPREHENSIVE METABOLIC PANEL
ALT: 9 IU/L (ref 0–32)
AST: 18 IU/L (ref 0–40)
Albumin: 4.9 g/dL (ref 3.9–4.9)
Alkaline Phosphatase: 79 IU/L (ref 44–121)
BUN/Creatinine Ratio: 20 (ref 9–23)
BUN: 17 mg/dL (ref 6–24)
Bilirubin Total: 0.6 mg/dL (ref 0.0–1.2)
CO2: 24 mmol/L (ref 20–29)
Calcium: 9.4 mg/dL (ref 8.7–10.2)
Chloride: 101 mmol/L (ref 96–106)
Creatinine, Ser: 0.85 mg/dL (ref 0.57–1.00)
Globulin, Total: 2.2 g/dL (ref 1.5–4.5)
Glucose: 97 mg/dL (ref 70–99)
Potassium: 4.5 mmol/L (ref 3.5–5.2)
Sodium: 138 mmol/L (ref 134–144)
Total Protein: 7.1 g/dL (ref 6.0–8.5)
eGFR: 83 mL/min/{1.73_m2} (ref >59)

## 2023-09-22 LAB — TSH: TSH: 2.22 u[IU]/mL (ref 0.450–4.500)

## 2023-09-23 ENCOUNTER — Encounter: Payer: Self-pay | Admitting: Obstetrics and Gynecology

## 2023-09-23 ENCOUNTER — Telehealth: Payer: Self-pay

## 2023-09-23 ENCOUNTER — Other Ambulatory Visit: Payer: Self-pay

## 2023-09-23 DIAGNOSIS — Z1211 Encounter for screening for malignant neoplasm of colon: Secondary | ICD-10-CM

## 2023-09-23 MED ORDER — NA SULFATE-K SULFATE-MG SULF 17.5-3.13-1.6 GM/177ML PO SOLN: 1.0000 | Freq: Once | ORAL | 0 refills | Status: AC

## 2023-09-23 NOTE — Telephone Encounter (Signed)
 Gastroenterology Pre-Procedure Review  Request Date: 11/10/23 Requesting Physician: Dr. Allegra Lai  PATIENT REVIEW QUESTIONS: The patient responded to the following health history questions as indicated:    1. Are you having any GI issues? no 2. Do you have a personal history of Polyps? no 3. Do you have a family history of Colon Cancer or Polyps? no 4. Diabetes Mellitus? no 5. Joint replacements in the past 12 months?no 6. Major health problems in the past 3 months?no 7. Any artificial heart valves, MVP, or defibrillator?no    MEDICATIONS & ALLERGIES:    Patient reports the following regarding taking any anticoagulation/antiplatelet therapy:   Plavix, Coumadin, Eliquis, Xarelto, Lovenox, Pradaxa, Brilinta, or Effient? no Aspirin? no  Patient confirms/reports the following medications:  Current Outpatient Medications  Medication Sig Dispense Refill   sertraline (ZOLOFT) 100 MG tablet Take 1 tablet (100 mg total) by mouth daily. 90 tablet 3   No current facility-administered medications for this visit.    Patient confirms/reports the following allergies:  Allergies  Allergen Reactions   Hydrocodone Itching, Rash and Hives    No orders of the defined types were placed in this encounter.   AUTHORIZATION INFORMATION Primary Insurance: 1D#: Group #:  Secondary Insurance: 1D#: Group #:  SCHEDULE INFORMATION: Date: 11/10/23 Time: Location: [facility]

## 2023-09-23 NOTE — Progress Notes (Signed)
 Gastroenterology Pre-Procedure Review  Request Date: 11/10/23 Requesting Physician: Dr. Allegra Lai  PATIENT REVIEW QUESTIONS: The patient responded to the following health history questions as indicated:    1. Are you having any GI issues? no 2. Do you have a personal history of Polyps? no 3. Do you have a family history of Colon Cancer or Polyps? no 4. Diabetes Mellitus? no 5. Joint replacements in the past 12 months?no 6. Major health problems in the past 3 months?no 7. Any artificial heart valves, MVP, or defibrillator?no    MEDICATIONS & ALLERGIES:    Patient reports the following regarding taking any anticoagulation/antiplatelet therapy:   Plavix, Coumadin, Eliquis, Xarelto, Lovenox, Pradaxa, Brilinta, or Effient? no Aspirin? no  Patient confirms/reports the following medications:  Current Outpatient Medications  Medication Sig Dispense Refill   sertraline (ZOLOFT) 100 MG tablet Take 1 tablet (100 mg total) by mouth daily. 90 tablet 3   No current facility-administered medications for this visit.    Patient confirms/reports the following allergies:  Allergies  Allergen Reactions   Hydrocodone Itching, Rash and Hives    No orders of the defined types were placed in this encounter.   AUTHORIZATION INFORMATION Primary Insurance: 1D#: Group #:  Secondary Insurance: 1D#: Group #:  SCHEDULE INFORMATION: Date: 11/10/23 Time: Location: [facility]

## 2023-11-10 ENCOUNTER — Ambulatory Visit: Admitting: Anesthesiology

## 2023-11-10 ENCOUNTER — Other Ambulatory Visit: Payer: Self-pay

## 2023-11-10 ENCOUNTER — Ambulatory Visit
Admission: RE | Admit: 2023-11-10 | Discharge: 2023-11-10 | Disposition: A | Discharge status: Home / Self Care | Attending: Gastroenterology | Admitting: Gastroenterology

## 2023-11-10 ENCOUNTER — Encounter
Admission: RE | Disposition: A | Discharge status: Home / Self Care | Payer: Self-pay | Source: Home / Self Care | Attending: Gastroenterology

## 2023-11-10 ENCOUNTER — Encounter: Payer: Self-pay | Admitting: Gastroenterology

## 2023-11-10 DIAGNOSIS — Z87891 Personal history of nicotine dependence: Secondary | ICD-10-CM | POA: Insufficient documentation

## 2023-11-10 DIAGNOSIS — D120 Benign neoplasm of cecum: Secondary | ICD-10-CM | POA: Diagnosis not present

## 2023-11-10 DIAGNOSIS — Z1211 Encounter for screening for malignant neoplasm of colon: Secondary | ICD-10-CM

## 2023-11-10 DIAGNOSIS — E669 Obesity, unspecified: Secondary | ICD-10-CM | POA: Diagnosis not present

## 2023-11-10 DIAGNOSIS — K6389 Other specified diseases of intestine: Secondary | ICD-10-CM | POA: Diagnosis not present

## 2023-11-10 DIAGNOSIS — F32A Depression, unspecified: Secondary | ICD-10-CM | POA: Diagnosis not present

## 2023-11-10 DIAGNOSIS — Z79899 Other long term (current) drug therapy: Secondary | ICD-10-CM | POA: Insufficient documentation

## 2023-11-10 DIAGNOSIS — Z6833 Body mass index (BMI) 33.0-33.9, adult: Secondary | ICD-10-CM | POA: Insufficient documentation

## 2023-11-10 DIAGNOSIS — K635 Polyp of colon: Secondary | ICD-10-CM | POA: Diagnosis not present

## 2023-11-10 HISTORY — PX: COLONOSCOPY: SHX5424

## 2023-11-10 HISTORY — PX: POLYPECTOMY: SHX149

## 2023-11-10 SURGERY — COLONOSCOPY
Anesthesia: General

## 2023-11-10 MED ORDER — PROPOFOL 500 MG/50ML IV EMUL
INTRAVENOUS | Status: DC | PRN
  Administered 2023-11-10: 140 ug/kg/min via INTRAVENOUS

## 2023-11-10 MED ORDER — SODIUM CHLORIDE 0.9 % IV SOLN
INTRAVENOUS | Status: DC
Start: 2023-11-10 — End: 2023-11-10

## 2023-11-10 MED ORDER — PROPOFOL 10 MG/ML IV BOLUS
INTRAVENOUS | Status: DC | PRN
  Administered 2023-11-10: 70 mg via INTRAVENOUS

## 2023-11-10 NOTE — H&P (Signed)
 Karma Oz, MD [address]  [address]  [place], [place] [address]  Main: [phone]  Fax: [phone] Pager: [phone]  Primary Care Physician:  Patient, No Pcp Per Primary Gastroenterologist:  Dr. Karma Oz  Pre-Procedure History & Physical: HPI:  Kristine Ochoa is a 48 y.o. female is here for an colonoscopy.   Past Medical History:  Diagnosis Date   Eczema    Mood disorder (HCC)    Obesity (BMI 30.0-34.9)    PMDD (premenstrual dysphoric disorder)    Screening for colon cancer 02/2021   NEG Cologuard    Past Surgical History:  Procedure Laterality Date   CESAREAN SECTION  2006   Twins   CHOLECYSTECTOMY     TONSILLECTOMY     WISDOM TOOTH EXTRACTION      Prior to Admission medications   Medication Sig Start Date End Date Taking? Authorizing Provider  sertraline  (ZOLOFT ) 100 MG tablet Take 1 tablet (100 mg total) by mouth daily. 09/21/23  Yes Copland, Alicia B, PA-C    Allergies as of 09/23/2023 - Review Complete 09/23/2023  Allergen Reaction Noted   Hydrocodone Itching, Rash, and Hives 01/08/2017    Family History  Problem Relation Age of Onset   Atrial fibrillation Mother    Hypertension Mother    Hyperlipidemia Mother    Parkinson's disease Mother 71   Prostate cancer Father 67   Atrial fibrillation Father    Prostate cancer Paternal Grandfather    Prostate cancer Paternal Uncle     Social History   Socioeconomic History   Marital status: Married    Spouse name: Not on file   Number of children: 3   Years of education: Not on file   Highest education level: Not on file  Occupational History   Not on file  Tobacco Use   Smoking status: Former   Smokeless tobacco: Former    Quit date: 2010  Vaping Use   Vaping status: Never Used  Substance and Sexual Activity   Alcohol use: Yes    Comment: social   Drug use: No   Sexual activity: Yes    Birth control/protection: Other-see comments    Comment: vasectomy  Other Topics  Concern   Not on file  Social History Narrative   Not on file   Social Drivers of Health   Financial Resource Strain: Not on file  Food Insecurity: Not on file  Transportation Needs: Not on file  Physical Activity: Sufficiently Active (12/22/2017)   Exercise Vital Sign    Days of Exercise per Week: 5 days    Minutes of Exercise per Session: 40 min  Stress: No Stress Concern Present (12/22/2017)   [name] of Occupational Health - Occupational Stress Questionnaire    Feeling of Stress : Not at all  Social Connections: Not on file  Intimate Partner Violence: Not on file    Review of Systems: See HPI, otherwise negative ROS  Physical Exam: BP 116/76   Temp (!) 96.8 F (36 C) (Temporal)   Resp 16   Ht 5\' 10"  (1.778 m)   Wt 106.6 kg   SpO2 97%   BMI 33.72 kg/m  General:   Alert,  pleasant and cooperative in NAD Head:  Normocephalic and atraumatic. Neck:  Supple; no masses or thyromegaly. Lungs:  Clear throughout to auscultation.    Heart:  Regular rate and rhythm. Abdomen:  Soft, nontender and nondistended. Normal bowel sounds, without guarding, and without rebound.   Neurologic:  Alert  and  oriented x4;  grossly normal neurologically.  Impression/Plan: Kristine Ochoa is here for an colonoscopy to be performed for colon cancer screening  Risks, benefits, limitations, and alternatives regarding  colonoscopy have been reviewed with the patient.  Questions have been answered.  All parties agreeable.   Ellis Guys, MD  11/10/2023, 9:45 AM

## 2023-11-10 NOTE — Transfer of Care (Signed)
 Immediate Anesthesia Transfer of Care Note  Patient: Kristine Ochoa  Procedure(s) Performed: COLONOSCOPY POLYPECTOMY, INTESTINE  Patient Location: PACU  Anesthesia Type:General  Level of Consciousness: awake, alert , and oriented  Airway & Oxygen Therapy: Patient Spontanous Breathing  Post-op Assessment: Report given to RN and Post -op Vital signs reviewed and stable  Post vital signs: Reviewed and stable  Last Vitals:  Vitals Value Taken Time  BP    Temp    Pulse    Resp    SpO2      Last Pain:  Vitals:   11/10/23 0942  TempSrc: Temporal  PainSc: 0-No pain         Complications: No notable events documented.

## 2023-11-10 NOTE — Op Note (Signed)
 [facility] Gastroenterology Patient Name: Kristine Ochoa Procedure Date: 11/10/2023 9:50 AM MRN: [id number] Account #: [id number] Date of Birth: [birth year] Admit Type: Outpatient Age: 48 Room: [facility] ENDO ROOM 4 Gender: Female Note Status: Finalized Instrument Name: Charlyn Cooley [id number] Procedure:             Colonoscopy Indications:           Screening for colorectal malignant neoplasm, This is                         the patient's first colonoscopy Providers:             Selena Daily MD, MD Referring MD:          No Local Md, MD (Referring MD) Medicines:             General Anesthesia Complications:         No immediate complications. Estimated blood loss: None. Procedure:             Pre-Anesthesia Assessment:                        - Prior to the procedure, a History and Physical was                         performed, and patient medications and allergies were                         reviewed. The patient is competent. The risks and                         benefits of the procedure and the sedation options and                         risks were discussed with the patient. All questions                         were answered and informed consent was obtained.                         Patient identification and proposed procedure were                         verified by the physician, the nurse, the                         anesthesiologist, the anesthetist and the technician                         in the pre-procedure area in the procedure room in the                         endoscopy suite. Mental Status Examination: alert and                         oriented. Airway Examination: normal oropharyngeal                         airway and neck mobility. Respiratory Examination:  clear to auscultation. CV Examination: normal.                         Prophylactic Antibiotics: The patient does not require                         prophylactic  antibiotics. Prior Anticoagulants: The                         patient has taken no anticoagulant or antiplatelet                         agents. ASA Grade Assessment: II - A patient with mild                         systemic disease. After reviewing the risks and                         benefits, the patient was deemed in satisfactory                         condition to undergo the procedure. The anesthesia                         plan was to use general anesthesia. Immediately prior                         to administration of medications, the patient was                         re-assessed for adequacy to receive sedatives. The                         heart rate, respiratory rate, oxygen saturations,                         blood pressure, adequacy of pulmonary ventilation, and                         response to care were monitored throughout the                         procedure. The physical status of the patient was                         re-assessed after the procedure.                        After obtaining informed consent, the colonoscope was                         passed under direct vision. Throughout the procedure,                         the patient's blood pressure, pulse, and oxygen                         saturations were monitored continuously. The  Colonoscope was introduced through the anus and                         advanced to the the cecum, identified by appendiceal                         orifice and ileocecal valve. The colonoscopy was                         performed without difficulty. The patient tolerated                         the procedure well. The quality of the bowel                         preparation was evaluated using the BBPS [facility] Bowel                         Preparation Scale) with scores of: Right Colon = 3,                         Transverse Colon = 3 and Left Colon = 3 (entire mucosa                         seen  well with no residual staining, small fragments                         of stool or opaque liquid). The total BBPS score                         equals 9. The ileocecal valve, appendiceal orifice,                         and rectum were photographed. Findings:      The perianal and digital rectal examinations were normal. Pertinent       negatives include normal sphincter tone and no palpable rectal lesions.      A 5 mm polyp was found in the cecum. The polyp was sessile. The polyp       was removed with a cold snare. Resection and retrieval were complete.       Estimated blood loss: none.      The retroflexed view of the distal rectum and anal verge was normal and       showed no anal or rectal abnormalities.      The exam was otherwise without abnormality. Impression:            - One 5 mm polyp in the cecum, removed with a cold                         snare. Resected and retrieved.                        - The distal rectum and anal verge are normal on                         retroflexion view.                        -  The examination was otherwise normal. Recommendation:        - Discharge patient to home (with escort).                        - Resume previous diet today.                        - Continue present medications.                        - Await pathology results.                        - Repeat colonoscopy in 7-10 years for surveillance                         based on pathology results. Procedure Code(s):     --- Professional ---                        [phone], Colonoscopy, flexible; with removal of                         tumor(s), polyp(s), or other lesion(s) by snare                         technique Diagnosis Code(s):     --- Professional ---                        Z12.11, Encounter for screening for malignant neoplasm                         of colon                        D12.0, Benign neoplasm of cecum CPT copyright 2022 American Medical Association. All rights  reserved. The codes documented in this report are preliminary and upon coder review may  be revised to meet current compliance requirements. Dr. Evia Hof Selena Daily MD, MD 11/10/2023 10:20:25 AM This report has been signed electronically. Number of Addenda: 0 Note Initiated On: 11/10/2023 9:50 AM Scope Withdrawal Time: 0 hours 8 minutes 1 second  Total Procedure Duration: 0 hours 12 minutes 24 seconds  Estimated Blood Loss:  Estimated blood loss: none.      [facility]

## 2023-11-10 NOTE — Anesthesia Postprocedure Evaluation (Signed)
 Anesthesia Post Note  Patient: Kristine Ochoa  Procedure(s) Performed: COLONOSCOPY POLYPECTOMY, INTESTINE  Patient location during evaluation: Endoscopy Anesthesia Type: General Level of consciousness: awake and alert Pain management: pain level controlled Vital Signs Assessment: post-procedure vital signs reviewed and stable Respiratory status: spontaneous breathing, nonlabored ventilation, respiratory function stable and patient connected to nasal cannula oxygen Cardiovascular status: blood pressure returned to baseline and stable Postop Assessment: no apparent nausea or vomiting Anesthetic complications: no   No notable events documented.   Last Vitals:  Vitals:   11/10/23 1032 11/10/23 1042  BP: (!) 89/68 114/73  Pulse: 70   Resp: 17 (!) 21  Temp:    SpO2: 98% 99%    Last Pain:  Vitals:   11/10/23 1042  TempSrc:   PainSc: 0-No pain                 Nancey Awkward

## 2023-11-10 NOTE — Anesthesia Preprocedure Evaluation (Signed)
 Anesthesia Evaluation  Patient identified by MRN, date of birth, ID band Patient awake    Reviewed: Allergy & Precautions, NPO status , Patient's Chart, lab work & pertinent test results  History of Anesthesia Complications Negative for: history of anesthetic complications  Airway Mallampati: II  TM Distance: >3 FB Neck ROM: full    Dental no notable dental hx.    Pulmonary neg pulmonary ROS, former smoker   Pulmonary exam normal        Cardiovascular negative cardio ROS Normal cardiovascular exam     Neuro/Psych  PSYCHIATRIC DISORDERS  Depression    negative neurological ROS     GI/Hepatic negative GI ROS, Neg liver ROS,,,  Endo/Other  negative endocrine ROS    Renal/GU negative Renal ROS  negative genitourinary   Musculoskeletal   Abdominal   Peds  Hematology negative hematology ROS (+)   Anesthesia Other Findings Past Medical History: No date: Eczema No date: Mood disorder (HCC) No date: Obesity (BMI 30.0-34.9) No date: PMDD (premenstrual dysphoric disorder) 02/2021: Screening for colon cancer     Comment:  NEG Cologuard  Past Surgical History: 2006: CESAREAN SECTION     Comment:  Twins No date: CHOLECYSTECTOMY No date: TONSILLECTOMY No date: WISDOM TOOTH EXTRACTION  BMI    Body Mass Index: 33.72 kg/m      Reproductive/Obstetrics negative OB ROS                             Anesthesia Physical Anesthesia Plan  ASA: 2  Anesthesia Plan: General   Post-op Pain Management: Minimal or no pain anticipated   Induction: Intravenous  PONV Risk Score and Plan: 2 and Propofol infusion and TIVA  Airway Management Planned: Natural Airway and Nasal Cannula  Additional Equipment:   Intra-op Plan:   Post-operative Plan:   Informed Consent: I have reviewed the patients History and Physical, chart, labs and discussed the procedure including the risks, benefits and  alternatives for the proposed anesthesia with the patient or authorized representative who has indicated his/her understanding and acceptance.     Dental Advisory Given  Plan Discussed with: Anesthesiologist, CRNA and Surgeon  Anesthesia Plan Comments: (Patient consented for risks of anesthesia including but not limited to:  - adverse reactions to medications - risk of airway placement if required - damage to eyes, teeth, lips or other oral mucosa - nerve damage due to positioning  - sore throat or hoarseness - Damage to heart, brain, nerves, lungs, other parts of body or loss of life  Patient voiced understanding and assent.)       Anesthesia Quick Evaluation

## 2023-11-12 LAB — SURGICAL PATHOLOGY

## 2023-11-15 ENCOUNTER — Encounter: Payer: Self-pay | Admitting: Gastroenterology

## 2024-04-26 ENCOUNTER — Ambulatory Visit (INDEPENDENT_AMBULATORY_CARE_PROVIDER_SITE_OTHER)

## 2024-04-26 ENCOUNTER — Ambulatory Visit: Admitting: Podiatry

## 2024-04-26 DIAGNOSIS — M722 Plantar fascial fibromatosis: Secondary | ICD-10-CM

## 2024-04-26 MED ORDER — MELOXICAM 15 MG PO TABS: 15.0000 mg | ORAL_TABLET | Freq: Every day | ORAL | 3 refills | Status: AC

## 2024-04-26 MED ORDER — TRIAMCINOLONE ACETONIDE 40 MG/ML IJ SUSP
20.0000 mg | Freq: Once | INTRAMUSCULAR | Status: AC
  Administered 2024-04-26: 20 mg

## 2024-04-26 MED ORDER — METHYLPREDNISOLONE 4 MG PO TBPK: ORAL_TABLET | ORAL | 0 refills | Status: AC

## 2024-04-26 NOTE — Progress Notes (Signed)
  Subjective:  Patient ID: Kristine Ochoa, female    DOB: [birth year],  MRN: [id number] HPI Chief Complaint  Patient presents with   Plantar Fasciitis    A lot of plantar fasciitis pain in my right foot since March of 2025. I am hoping for a shot for pain.    48 y.o. female presents with the above complaint.   ROS: Denies fever chills nausea vomit muscle aches pains calf pain back pain chest pain shortness of breath.  Past Medical History:  Diagnosis Date   Eczema    Mood disorder    Obesity (BMI 30.0-34.9)    PMDD (premenstrual dysphoric disorder)    Screening for colon cancer 02/2021   NEG Cologuard   Past Surgical History:  Procedure Laterality Date   CESAREAN SECTION  2006   Twins   CHOLECYSTECTOMY     COLONOSCOPY N/A 11/10/2023   Procedure: COLONOSCOPY;  Surgeon: Unk Corinn Skiff, MD;  Location: [facility] ENDOSCOPY;  Service: Gastroenterology;  Laterality: N/A;   POLYPECTOMY  11/10/2023   Procedure: POLYPECTOMY, INTESTINE;  Surgeon: Unk Corinn Skiff, MD;  Location: [facility] ENDOSCOPY;  Service: Gastroenterology;;   TONSILLECTOMY     WISDOM TOOTH EXTRACTION      Current Outpatient Medications:    meloxicam (MOBIC) 15 MG tablet, Take 1 tablet (15 mg total) by mouth daily., Disp: 30 tablet, Rfl: 3   methylPREDNISolone  (MEDROL  DOSEPAK) 4 MG TBPK tablet, 6 day dose pack - take as directed, Disp: 21 tablet, Rfl: 0   sertraline  (ZOLOFT ) 100 MG tablet, Take 1 tablet (100 mg total) by mouth daily., Disp: 90 tablet, Rfl: 3  Allergies  Allergen Reactions   Hydrocodone Itching, Rash and Hives   Review of Systems Objective:  There were no vitals filed for this visit.  General: Well developed, nourished, in no acute distress, alert and oriented x3   Dermatological: Skin is warm, dry and supple bilateral. Nails x 10 are well maintained; remaining integument appears unremarkable at this time. There are no open sores, no preulcerative lesions, no rash or signs of infection  present.  Vascular: Dorsalis Pedis artery and Posterior Tibial artery pedal pulses are 2/4 bilateral with immedate capillary fill time. Pedal hair growth present. No varicosities and no lower extremity edema present bilateral.   Neruologic: Grossly intact via light touch bilateral. Vibratory intact via tuning fork bilateral. Protective threshold with Semmes Wienstein monofilament intact to all pedal sites bilateral. Patellar and Achilles deep tendon reflexes 2+ bilateral. No Babinski or clonus noted bilateral.   Musculoskeletal: No gross boney pedal deformities bilateral. No pain, crepitus, or limitation noted with foot and ankle range of motion bilateral. Muscular strength 5/5 in all groups tested bilateral.  She has pain on palpation medial calcaneal tubercle of the right heel.  Gait: Unassisted, Nonantalgic.    Radiographs:  Radiographs taken today demonstrate osseously mature individual soft tissue increase in density at the plantar fascial calcaneal insertion site.  She also has retrocalcaneal and plantar calcaneal spurs. Assessment & Plan:   Assessment: Plantar fasciitis right foot  Plan: Discussed etiology pathology conservative versus surgical therapies.  I injected her right heel today 20 mg Kenalog  5 mg of Marcaine at the glabrous.  Started her on methylprednisolone  to be followed by meloxicam and placed her in a plantar fascia brace.     Kristine Ochoa, [place]

## 2024-04-26 NOTE — Patient Instructions (Signed)
 Exercises for Plantar Fasciitis Foot and leg exercises can help if you have plantar fasciitis. Only do the exercises you were told to do. Make sure you know how to do the exercises safely. Follow the steps below. It's normal to feel mild discomfort. Stop if you feel pain or your pain gets worse. Do not start these exercises until told by your health care provider. Stretching and range-of-motion exercises These exercises warm up your muscles and joints. They also help with movement and flexibility of your foot. They can help with pain. Plantar fascia stretch This exercise will stretch your plantar fascia, which is a band of thick tissue on the bottom of your foot. Sit with your left / right leg crossed over your other knee. Hold your heel with one hand with that thumb near your arch. With your other hand, hold your toes. Gently pull your toes back toward the top of your foot. You should feel a stretch on the bottom of your toes, on the bottom of your foot, or both. Hold this stretch for __________ seconds. Slowly let go of your toes. Go back to the starting position. Repeat __________ times. Do this exercise __________ times a day. Gastroc stretch, standing This exercise is called an upper calf, or gastroc, stretch. It stretches the muscles in the back of your upper calf. Stand with your hands against a wall. Extend your left / right leg behind you. Bend your front knee just a little. Keep your heels on the floor, your toes facing forward, and your back knee straight. Shift your weight toward the wall. Do not arch your back. You should feel a gentle stretch in your upper calf. Hold this position for __________ seconds. Repeat __________ times. Do this exercise __________ times a day. Soleus stretch, standing This exercise is called a lower calf, or soleus, stretch. It stretches the muscles in the back of your lower calf. Stand with your hands against a wall. Extend your left / right leg behind  you, and bend your front knee slightly. Keep your heels on the floor and your toes facing forward. Bend your back knee and shift your weight slightly over your back leg. You should feel a gentle stretch deep in your lower calf. Hold this position for __________ seconds. Repeat __________ times. Do this exercise __________ times a day. Gastroc and soleus stretch, standing step This exercise stretches the muscles in the back of your lower leg. This includes your gastroc and soleus muscles. Stand with the ball of your left / right foot on the front of a step. The ball of your foot is on the walking surface, right under your toes. Keep your other foot firmly on the same step. Hold on to the wall or a railing for balance. Slowly lift your other foot, letting your body weight press your heel down over the edge of the front of the step. Keep your knee straight and unbent. You should feel a stretch in your calf. Hold this position for __________ seconds. Return both feet to the step. Repeat this exercise with a slight bend in your left / right knee. Repeat __________ times with your left / right knee straight and __________ times with your left / right knee bent. Do this exercise __________ times a day. Balance exercise This exercise builds your balance and strength control of your arch. It helps take pressure off your plantar fascia. Single leg stand If this exercise is too easy, you can try it with your eyes closed  or while standing on a pillow. Without shoes, stand near a railing or in a doorway. You may hold on to the railing or doorway as needed. Stand on your left / right foot. Keep your big toe down on the floor. Lift the arch of your foot. You should feel a stretch across the bottom of your foot and arch. Do not let your foot roll inward. Hold this position for __________ seconds. Repeat __________ times. Do this exercise __________ times a day. This information is not intended to replace  advice given to you by your health care provider. Make sure you discuss any questions you have with your health care provider. Document Revised: 11/23/2022 Document Reviewed: 11/23/2022 [employer] Patient Education  2024 [employer].

## 2024-08-17 ENCOUNTER — Ambulatory Visit

## 2024-11-09 ENCOUNTER — Ambulatory Visit: Admitting: Internal Medicine
